# Patient Record
Sex: Female | Born: 1965 | Race: White | Hispanic: No | Smoking: Former smoker
Health system: Southern US, Community
[De-identification: ages and names within clinical notes are randomized; demographics above are authoritative.]

## PROBLEM LIST (undated history)

## (undated) DIAGNOSIS — F17200 Nicotine dependence, unspecified, uncomplicated: Secondary | ICD-10-CM

## (undated) DIAGNOSIS — N200 Calculus of kidney: Secondary | ICD-10-CM

## (undated) DIAGNOSIS — F419 Anxiety disorder, unspecified: Secondary | ICD-10-CM

## (undated) DIAGNOSIS — I499 Cardiac arrhythmia, unspecified: Secondary | ICD-10-CM

## (undated) DIAGNOSIS — F329 Major depressive disorder, single episode, unspecified: Secondary | ICD-10-CM

## (undated) DIAGNOSIS — F32A Depression, unspecified: Secondary | ICD-10-CM

## (undated) DIAGNOSIS — M549 Dorsalgia, unspecified: Secondary | ICD-10-CM

## (undated) DIAGNOSIS — G8929 Other chronic pain: Secondary | ICD-10-CM

## (undated) HISTORY — DX: Nicotine dependence, unspecified, uncomplicated: F17.200

## (undated) HISTORY — DX: Other chronic pain: G89.29

## (undated) HISTORY — DX: Anxiety disorder, unspecified: F41.9

## (undated) HISTORY — PX: SPINAL FUSION: SHX223

## (undated) HISTORY — DX: Depression, unspecified: F32.A

## (undated) HISTORY — DX: Dorsalgia, unspecified: M54.9

## (undated) HISTORY — DX: Major depressive disorder, single episode, unspecified: F32.9

## (undated) HISTORY — PX: OOPHORECTOMY: SHX86

---
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
## 1997-11-06 ENCOUNTER — Inpatient Hospital Stay (HOSPITAL_COMMUNITY): Admission: AD | Admit: 1997-11-06 | Discharge: 1997-11-06 | Payer: Self-pay | Admitting: Obstetrics and Gynecology

## 1998-01-30 ENCOUNTER — Ambulatory Visit (HOSPITAL_COMMUNITY): Admission: RE | Admit: 1998-01-30 | Discharge: 1998-01-30 | Payer: Self-pay | Admitting: Obstetrics and Gynecology

## 1998-01-30 ENCOUNTER — Encounter: Payer: Self-pay | Admitting: Obstetrics and Gynecology

## 1998-03-16 ENCOUNTER — Other Ambulatory Visit: Admission: RE | Admit: 1998-03-16 | Discharge: 1998-03-16 | Payer: Self-pay | Admitting: Obstetrics and Gynecology

## 1998-05-02 ENCOUNTER — Ambulatory Visit (HOSPITAL_COMMUNITY): Admission: RE | Admit: 1998-05-02 | Discharge: 1998-05-02 | Payer: Self-pay | Admitting: Obstetrics and Gynecology

## 1998-12-08 ENCOUNTER — Inpatient Hospital Stay (HOSPITAL_COMMUNITY): Admission: AD | Admit: 1998-12-08 | Discharge: 1998-12-08 | Payer: Self-pay | Admitting: Obstetrics and Gynecology

## 1999-05-25 ENCOUNTER — Other Ambulatory Visit: Admission: RE | Admit: 1999-05-25 | Discharge: 1999-05-25 | Payer: Self-pay | Admitting: Obstetrics and Gynecology

## 1999-07-28 ENCOUNTER — Inpatient Hospital Stay (HOSPITAL_COMMUNITY): Admission: AD | Admit: 1999-07-28 | Discharge: 1999-07-28 | Payer: Self-pay | Admitting: Obstetrics and Gynecology

## 1999-11-14 ENCOUNTER — Inpatient Hospital Stay (HOSPITAL_COMMUNITY): Admission: AD | Admit: 1999-11-14 | Discharge: 1999-11-17 | Payer: Self-pay | Admitting: *Deleted

## 1999-12-10 ENCOUNTER — Encounter: Admission: RE | Admit: 1999-12-10 | Discharge: 2000-03-09 | Payer: Self-pay | Admitting: Obstetrics and Gynecology

## 1999-12-23 ENCOUNTER — Other Ambulatory Visit: Admission: RE | Admit: 1999-12-23 | Discharge: 1999-12-23 | Payer: Self-pay | Admitting: Obstetrics and Gynecology

## 2001-01-15 ENCOUNTER — Other Ambulatory Visit: Admission: RE | Admit: 2001-01-15 | Discharge: 2001-01-15 | Payer: Self-pay | Admitting: Obstetrics and Gynecology

## 2002-01-21 ENCOUNTER — Other Ambulatory Visit: Admission: RE | Admit: 2002-01-21 | Discharge: 2002-01-21 | Payer: Self-pay | Admitting: Obstetrics and Gynecology

## 2002-09-26 ENCOUNTER — Encounter: Payer: Self-pay | Admitting: Specialist

## 2002-09-26 ENCOUNTER — Encounter: Admission: RE | Admit: 2002-09-26 | Discharge: 2002-09-26 | Payer: Self-pay | Admitting: Specialist

## 2003-02-12 ENCOUNTER — Other Ambulatory Visit: Admission: RE | Admit: 2003-02-12 | Discharge: 2003-02-12 | Payer: Self-pay | Admitting: Obstetrics and Gynecology

## 2003-12-11 ENCOUNTER — Encounter: Admission: RE | Admit: 2003-12-11 | Discharge: 2003-12-11 | Payer: Self-pay | Admitting: Obstetrics and Gynecology

## 2004-03-22 ENCOUNTER — Other Ambulatory Visit: Admission: RE | Admit: 2004-03-22 | Discharge: 2004-03-22 | Payer: Self-pay | Admitting: Obstetrics and Gynecology

## 2005-01-05 ENCOUNTER — Encounter: Admission: RE | Admit: 2005-01-05 | Discharge: 2005-01-05 | Payer: Self-pay | Admitting: Obstetrics and Gynecology

## 2005-03-07 HISTORY — PX: TOTAL ABDOMINAL HYSTERECTOMY W/ BILATERAL SALPINGOOPHORECTOMY: SHX83

## 2005-04-08 ENCOUNTER — Other Ambulatory Visit: Admission: RE | Admit: 2005-04-08 | Discharge: 2005-04-08 | Payer: Self-pay | Admitting: Obstetrics and Gynecology

## 2006-01-30 ENCOUNTER — Encounter: Admission: RE | Admit: 2006-01-30 | Discharge: 2006-01-30 | Payer: Self-pay | Admitting: Obstetrics and Gynecology

## 2007-02-05 ENCOUNTER — Encounter: Admission: RE | Admit: 2007-02-05 | Discharge: 2007-02-05 | Payer: Self-pay | Admitting: Obstetrics and Gynecology

## 2007-03-08 HISTORY — PX: CHOLECYSTECTOMY: SHX55

## 2007-05-09 ENCOUNTER — Encounter: Payer: Self-pay | Admitting: Family Medicine

## 2007-08-02 ENCOUNTER — Ambulatory Visit (HOSPITAL_COMMUNITY): Admission: RE | Admit: 2007-08-02 | Discharge: 2007-08-02 | Payer: Self-pay | Admitting: Urology

## 2008-01-29 ENCOUNTER — Ambulatory Visit (HOSPITAL_COMMUNITY): Admission: RE | Admit: 2008-01-29 | Discharge: 2008-01-30 | Payer: Self-pay | Admitting: Obstetrics and Gynecology

## 2008-01-29 ENCOUNTER — Encounter (INDEPENDENT_AMBULATORY_CARE_PROVIDER_SITE_OTHER): Payer: Self-pay | Admitting: Obstetrics and Gynecology

## 2008-02-22 ENCOUNTER — Encounter: Admission: RE | Admit: 2008-02-22 | Discharge: 2008-02-22 | Payer: Self-pay | Admitting: Obstetrics and Gynecology

## 2008-02-25 ENCOUNTER — Encounter: Admission: RE | Admit: 2008-02-25 | Discharge: 2008-02-25 | Payer: Self-pay | Admitting: Obstetrics and Gynecology

## 2009-02-23 ENCOUNTER — Encounter: Admission: RE | Admit: 2009-02-23 | Discharge: 2009-02-23 | Payer: Self-pay | Admitting: Obstetrics and Gynecology

## 2009-04-09 ENCOUNTER — Ambulatory Visit: Payer: Self-pay | Admitting: Family Medicine

## 2009-04-09 DIAGNOSIS — L0100 Impetigo, unspecified: Secondary | ICD-10-CM | POA: Insufficient documentation

## 2009-04-09 DIAGNOSIS — H00019 Hordeolum externum unspecified eye, unspecified eyelid: Secondary | ICD-10-CM | POA: Insufficient documentation

## 2010-02-24 ENCOUNTER — Encounter
Admission: RE | Admit: 2010-02-24 | Discharge: 2010-02-24 | Payer: Self-pay | Source: Home / Self Care | Attending: Obstetrics and Gynecology | Admitting: Obstetrics and Gynecology

## 2010-03-25 ENCOUNTER — Ambulatory Visit
Admission: RE | Admit: 2010-03-25 | Discharge: 2010-03-25 | Payer: Self-pay | Source: Home / Self Care | Attending: Family Medicine | Admitting: Family Medicine

## 2010-03-25 ENCOUNTER — Encounter: Payer: Self-pay | Admitting: Family Medicine

## 2010-03-25 DIAGNOSIS — J209 Acute bronchitis, unspecified: Secondary | ICD-10-CM | POA: Insufficient documentation

## 2010-03-25 DIAGNOSIS — F17200 Nicotine dependence, unspecified, uncomplicated: Secondary | ICD-10-CM | POA: Insufficient documentation

## 2010-03-28 ENCOUNTER — Encounter: Payer: Self-pay | Admitting: Obstetrics and Gynecology

## 2010-04-06 NOTE — Assessment & Plan Note (Signed)
Summary: SINUS/KH   Vital Signs:  Patient Profile:   45 Years Old Female Height:     67 inches Weight:      140 pounds O2 Sat:      99 % O2 treatment:    Room Air Temp:     98.0 degrees F oral Pulse rate:   106 / minute Pulse rhythm:   regular Resp:     16 per minute BP sitting:   124 / 82  (right arm) Cuff size:   regular  Vitals Entered By: Lita Mains, RN                  Updated Prior Medication List: CELEXA 20 MG TABS (CITALOPRAM HYDROBROMIDE) once daily MENEST 1.25 MG TABS (ESTERIFIED ESTROGENS) bid  Current Allergies: No known allergies History of Present Illness History from: patient History of Present Illness: Patient states that since Thanksgiving 2010 she has had "sores or pimples" that keep coming up on her face that itch. [facility] here thing it may be staph. She also c/o sinus congestion for the past week. Her son has the flU. DENIES FEVER. HAS DEVLEOPED A STY UPPER RIGHT EYELIDE  REVIEW OF SYSTEMS Constitutional Symptoms      Denies fever, chills, night sweats, weight loss, weight gain, and fatigue.  Eyes       Denies change in vision, eye pain, eye discharge, glasses, contact lenses, and eye surgery. Ear/Nose/Throat/Mouth       Complains of frequent runny nose and sinus problems.      Denies hearing loss/aids, change in hearing, ear pain, ear discharge, dizziness, frequent nose bleeds, sore throat, hoarseness, and tooth pain or bleeding.  Respiratory       Denies dry cough, productive cough, wheezing, shortness of breath, asthma, bronchitis, and emphysema/COPD.  Cardiovascular       Denies murmurs, chest pain, and tires easily with exhertion.    Gastrointestinal       Denies stomach pain, nausea/vomiting, diarrhea, constipation, blood in bowel movements, and indigestion. Genitourniary       Denies painful urination, kidney stones, and loss of urinary control. Neurological       Denies paralysis, seizures, and  fainting/blackouts. Musculoskeletal       Denies muscle pain, joint pain, joint stiffness, decreased range of motion, redness, swelling, muscle weakness, and gout.  Skin       Complains of unusual moles/lumps or sores.      Denies bruising and hair/skin or nail changes.  Psych       Denies mood changes, temper/anger issues, anxiety/stress, speech problems, depression, and sleep problems.  Past History:  Past Medical History: Unremarkable  Past Surgical History: oopharectomy-09 Cholecystectomy Hysterectomy  Family History: Family History Thyroid disease-mother  Social History: Married Current Smoker-<1PPD Alcohol use-yes Smoking Status:  current Physical Exam General appearance: well developed, well nourished, no acute distress Head: normocephalic, atraumatic Eyes: RIGHT UPPER LID WITH FOCAL ERYTHEMA AND SWELLING NASAL SIDE Ears: normal, no lesions or deformities Nasal: mucosa pink, nonedematous, no septal deviation, turbinates normal Oral/Pharynx: tongue normal, posterior pharynx without erythema or exudate Chest/Lungs: no rales, wheezes, or rhonchi bilateral, breath sounds equal without effort Heart: regular rate and  rhythm, no murmur Skin: SCABBED LESION THAT IS CRUSTED LEFT SIDE OF CHIN Assessment New Problems: 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 APPLY BACTROBAN AS DIRECTED. APPLY WORM COMPRESSES TO STY. MAY USE OTC STY OINTMENT. MUCINEX  RECOMMENDED. FOLLOW UP WITH YOUR PCP OR RETURN IF SYMPTOMS PERSIST OR WORSEN.

## 2010-04-08 NOTE — Assessment & Plan Note (Signed)
Summary: Bronchitis?/vfw   Vital Signs:  Patient profile:   45 year old female Height:      66.25 inches Weight:      148 pounds BMI:     23.79 O2 Sat:      98 % on Room air Temp:     98.0 degrees F oral Pulse rate:   94 / minute BP sitting:   128 / 84  (right arm) Cuff size:   regular  Vitals Entered By: Francee Piccolo CMA Duncan Dull) (March 25, 2010 8:43 AM)  O2 Flow:  Room air CC: cough, chest tightness, [facility] for [facility] 1[facility]-given amoxicillin Is Patient Diabetic? No   History of Present Illness: 45 y/o WF, smoker, presents for first visit here: cough. Onset of runny nose/congestion in head about 5-6 wks ago, had no fever or significant cough.  This part cleared up and a cough developed, chest feels tight and has mild wheezing intermittently.  No fever or SOB.  No ST.  Appetite fine, energy level okay.  No chest pains. Denies hx of need of inhaler for any resp illnesses in the past.   Early on in the course of this illness [facility] and was rx'd amoxil and she says this has not changed anything.  Preventive Screening-Counseling & Management  Alcohol-Tobacco     Alcohol drinks/day: 0     Smoking Status: current  Current Medications (verified): 1)  Sertraline Hcl 100 Mg Tabs (Sertraline Hcl) .Marland Kitchen.. 1 By Mouth Daily 2)  Estradiol 1 Mg Tabs (Estradiol) .... Take 1 in Am and .5 in Pm 3)  Bee Pollen 580 Mg Caps (Bee Pollen) .... Take 1 Capsule By Mouth Once A Day 4)  Multivitamins  Tabs (Multiple Vitamin) .... Take 1 Tablet By Mouth Once A Day  Allergies (verified): No Known Drug Allergies  Past History:  Past Medical History: Chronic back pain: hx of childhood scoliosis, has stabilization rods in back. Tobacco dependence Depression/Anxiety  Family History: Family History Thyroid disease and HTN-mother Father: polymyalgia rheumatica, HTN Brother: HTN  Social History: Married, 26 y/o boy and 46 y/o girl, lives in [place],  [place]. 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  Review of Systems  The patient denies anorexia, fever, weight loss, weight gain, vision loss, decreased hearing, hoarseness, chest pain, syncope, dyspnea on exertion, peripheral edema, headaches, hemoptysis, abdominal pain, melena, hematochezia, severe indigestion/heartburn, hematuria, incontinence, genital sores, muscle weakness, suspicious skin lesions, transient blindness, difficulty walking, depression, unusual weight change, abnormal bleeding, enlarged lymph nodes, angioedema, and breast masses.    Physical Exam  General:  VS: noted, all normal. Gen: Alert, well appearing, oriented x 4. HEENT: Scalp without lesions or hair loss.  Ears: EACs clear, normal epithelium.  TMs with good light reflex and landmarks bilaterally.  Eyes: no injection, icteris, swelling, or exudate.  EOMI, PERRLA. Nose: no drainage or turbinate edema/swelling.  No inection or focal lesion.  Mouth: lips without lesion/swelling.  Oral mucosa pink and moist.  Dentition intact and without obvious caries or gingival swelling.  Oropharynx without erythema, exudate, or swelling.  Neck: supple.  No lymphadenopathy, thyromegaly, or mass. Chest: symmetric expansion, with nonlabored respirations.  Clear and equal breath sounds in all lung fields.   CV: RRR, no m/r/g.  Peripheral pulses 2+/symmetric. EXT: no clubbing, cyanosis, or edema.     Impression & Recommendations:  Problem # 1:  ACUTE BRONCHITIS (ICD-466.0) Assessment New Prednisone 40mg  once daily x 5d.  Ventolin q4h as needed.  Hydrocodone cough syrup q6h as needed.  Encouraged smoking cessation.  She has had her flu vaccine this season.  The following medications were removed from the medication list:    Doxycycline Hyclate 100 Mg Caps (Doxycycline hyclate) .Marland Kitchen... 1 by mouth q 12 hrs Her updated medication list for this problem includes:    Ventolin Hfa 108 (90 Base) Mcg/act Aers (Albuterol  sulfate) .Marland Kitchen... 1-2 puffs q4h as needed chest tightness, excessive coughing, wheezing    Hydrocodone-homatropine 5-1.5 Mg/48ml Syrp (Hydrocodone-homatropine) .Marland Kitchen... 1 tsp q6h as needed for cough  Problem # 2:  TOBACCO ABUSE (ICD-305.1) Assessment: Unchanged Encourage cessation, spent discussing this.  She wants to quit but is not ready to attempt cessation at this time.  Complete Medication List: 1)  Sertraline Hcl 100 Mg Tabs (Sertraline hcl) .Marland Kitchen.. 1 by mouth daily 2)  Estradiol 1 Mg Tabs (Estradiol) .... Take 1 in am and .5 in pm 3)  Bee Pollen 580 Mg Caps (Bee pollen) .... Take 1 capsule by mouth once a day 4)  Multivitamins Tabs (Multiple vitamin) .... Take 1 tablet by mouth once a day 5)  Prednisone 20 Mg Tabs (Prednisone) .... 2 tabs by mouth once daily x 5d 6)  Ventolin Hfa 108 (90 Base) Mcg/act Aers (Albuterol sulfate) .Marland Kitchen.. 1-2 puffs q4h as needed chest tightness, excessive coughing, wheezing 7)  Hydrocodone-homatropine 5-1.5 Mg/76ml Syrp (Hydrocodone-homatropine) .Marland Kitchen.. 1 tsp q6h as needed for cough  Patient Instructions: 1)  Call or return if not improving in the next 5-7d. Prescriptions: HYDROCODONE-HOMATROPINE 5-1.5 MG/5ML SYRP (HYDROCODONE-HOMATROPINE) 1 tsp q6h as needed for cough  #4 oz x 0   Entered and Authorized by:   Michell Heinrich M.D.   Signed by:   Michell Heinrich M.D. on 03/25/2010   Method used:   Print then Give to Patient   RxID:   [id number] VENTOLIN HFA 108 (90 BASE) MCG/ACT AERS (ALBUTEROL SULFATE) 1-2 puffs q4h as needed chest tightness, excessive coughing, wheezing  #1 x 0   Entered and Authorized by:   Michell Heinrich M.D.   Signed by:   Michell Heinrich M.D. on 03/25/2010   Method used:   Electronically to        [facility]  [address] [phone]* (retail)       [address] [place], [place]  [address]       Ph: [phone] or [phone]       Fax: [phone]   RxID:   [id number] PREDNISONE 20 MG TABS (PREDNISONE)  2 tabs by mouth once daily x 5d  #10 x 0   Entered and Authorized by:   Michell Heinrich M.D.   Signed by:   Michell Heinrich M.D. on 03/25/2010   Method used:   Electronically to        [facility]  [address] [phone]* (retail)       [address] [place], [place]  [address]       Ph: [phone] or [phone]       Fax: [phone]   RxID:   [id number]    Orders Added: 1)  New Patient Level III [99203]   Immunization History:  Influenza Immunization History:    Influenza:  historical (12/05/2009)   Immunization History:  Influenza Immunization History:    Influenza:  Historical (  12/05/2009)  Prevention & Chronic Care Immunizations   Influenza vaccine: Historical  (12/05/2009)    Tetanus booster: Not documented    Pneumococcal vaccine: Not documented  Other Screening   Pap smear: Not documented    Mammogram: Not documented   Smoking status: current  (03/25/2010)  Lipids   Total Cholesterol: Not documented   LDL: Not documented   LDL Direct: Not documented   HDL: Not documented   Triglycerides: Not documented

## 2010-04-08 NOTE — Miscellaneous (Signed)
  Clinical Lists Changes  Medications: Removed medication of HYDROCODONE-HOMATROPINE 5-1.5 MG/5ML SYRP (HYDROCODONE-HOMATROPINE) 1 tsp q6h as needed for cough Removed medication of VENTOLIN HFA 108 (90 BASE) MCG/ACT AERS (ALBUTEROL SULFATE) 1-2 puffs q4h as needed chest tightness, excessive coughing, wheezing Added new medication of XANAX 0.25 MG TABS (ALPRAZOLAM) 1/2 tab q8h as needed for severe anxiety Observations: Added new observation of PAST SURG HX: Cholecystectomy 2009 BSO & Hysterectomy 2007 C-section 2001 Scoliosis stabilization 1979  (03/25/2010 10:50) Added new observation of PRIMARY MD: Nicoletta Ba, MD (03/25/2010 10:50)       Past History:  Past Surgical History: Cholecystectomy 2009 BSO & Hysterectomy 2007 C-section 2001 Scoliosis stabilization 1979   Allergies: No Known Drug Allergies

## 2010-04-13 ENCOUNTER — Encounter: Payer: Self-pay | Admitting: Family Medicine

## 2010-04-22 NOTE — Miscellaneous (Signed)
  Clinical Lists Changes  Observations: Added new observation of UROLOGY MD: Dr. Vonita Moss ([facility]) (04/13/2010 11:09) Added new observation of GYNECO MD: Dr. Marcelle Overlie ([facility]) (04/13/2010 11:09) Added new observation of PAST MED HX: Chronic back pain: hx of childhood scoliosis, has stabilization rods in back. Tobacco dependence Depression/Anxiety Nephrolithiasis Infertility (Clomid aided in + preg 2001) Early menopause (39) Osteopenia (DEXA 2007--Boniva rx'd after) Constipation (04/13/2010 11:09) Added new observation of PAST SURG HX: Cholecystectomy 2009 BSO & LAVH 2009 Lithotripsy 2009 C-section 2001 (breech and PIH) Left ovarian cyst lapyroscopic drainage 2000 Scoliosis stabilization 1979  (04/13/2010 11:09) Added new observation of PRIMARY MD: Nicoletta Ba, MD (04/13/2010 11:09)       Past History:  Past Medical History: Chronic back pain: hx of childhood scoliosis, has stabilization rods in back. Tobacco dependence Depression/Anxiety Nephrolithiasis Infertility (Clomid aided in + preg 2001) Early menopause (39) Osteopenia (DEXA 2007--Boniva rx'd after) Constipation  Past Surgical History: Cholecystectomy 2009 BSO & LAVH 2009 Lithotripsy 2009 C-section 2001 (breech and [facility]) Left ovarian cyst lapyroscopic drainage 2000 Scoliosis stabilization 1979   Care Coordination Gynecologist: Dr. Marcelle Overlie ([facility]) Urologist: Dr. Vonita Moss ([facility])

## 2010-05-13 NOTE — Letter (Signed)
Summary: [facility] records  [facility] records   Imported By: Lester Whigham 05/07/2010 07:35:23  _____________________________________________________________________  External Attachment:    Type:   Image     Comment:   External Document

## 2010-05-14 ENCOUNTER — Encounter: Payer: Self-pay | Admitting: *Deleted

## 2010-07-20 NOTE — H&P (Signed)
Katherine Brock, Katherine Brock              ACCOUNT NO.:  [id number]   MEDICAL RECORD NO.:  [id number]          PATIENT TYPE:  AMB   LOCATION:  SDC                           [facility]ILITY:  [facility]   PHYSICIAN:  Duke Salvia. Marcelle Brock, M.D.DATE OF BIRTH:  [birth year]   DATE OF ADMISSION:  DATE OF DISCHARGE:                              HISTORY & PHYSICAL   CHIEF COMPLAINT:  1. Ovarian cyst.  2. Pelvic pain.   HISTORY OF PRESENT ILLNESS:  A 45 year old G1, P1, one previous cesarean  section.  She has one adopted child, two living children.  Has gone  through early menopause, is currently on ERT estradiol 1 mg daily.  Earlier this year she had kidney stones and in the process of evaluating  her by CT she was noted to have a simple-appearing ovarian cyst that has  persisted.  She did have lithotripsy for her stones which cleared up but  part of her evaluation July of 2009 demonstrated a CA-125 that was  normal at 8.8, her ultrasound in July and then again in September of  2009 showed a right ovary with a small 1.6 x 1.6 cm simple cyst, the  left ovary 6.1 x 5.4 with internal echoes possibly suggestive of a  cystadenoma.  She presents now for LAVH BSO.  This procedure including  risks related to bleeding, infection, adjacent organ injury, the  possible need for open or additional surgery are reviewed with her.  We  also discussed expected recovery time, the possible need for ERT and  other risks related, transfusion, phlebitis, etc.   PAST MEDICAL HISTORY:   ALLERGIES:  NONE.   CURRENT MEDICATIONS:  1. Celexa.  2. Multivitamins.  3. Vitamin C.  4. Estradiol 1 mg daily.   REVIEW OF SYSTEMS:  1. Significant for a history of scoliosis, she has had steel rods      implanted in 1979.  2. C-section 2001.  3. Cholecystectomy in 2009.  4. Lithotripsy for kidney stones.   FAMILY HISTORY:  Significant for history of arthritis and hypertension.   PHYSICAL EXAM:  Temperature 98.2, blood pressure 110/80.  HEENT:  Unremarkable.  NECK:  Supple without mass.  LUNGS:  Clear.  CARDIOVASCULAR:  Regular rate and rhythm without murmurs, rubs or  gallops.  BREASTS:  Without masses.  ABDOMEN:  Soft, flat and nontender.  PELVIC:  Normal external genitalia, vagina and cervix clear.  Uterus mid-  positional size.  Adnexa negative, slightly full on the left, no  definite mass noted.  Extremities and neurologic exam unremarkable.   IMPRESSION:  Persistent left ovarian cyst, possible cystadenoma   PLAN:  LAVH BSO, procedure and risks reviewed as above.      Katherine Brock, M.D.  Electronically Signed     RMH/MEDQ  D:  01/28/2008  T:  01/28/2008  Job:  [id number]

## 2010-07-20 NOTE — Op Note (Signed)
Katherine Brock, Katherine Brock              ACCOUNT NO.:  [id number]   MEDICAL RECORD NO.:  [id number]          PATIENT TYPE:  OIB   LOCATION:  9306                          [facility]ILITY:  [facility]   PHYSICIAN:  Duke Salvia. Marcelle Overlie, M.D.DATE OF BIRTH:  Aug 01, 1965   DATE OF PROCEDURE:  01/29/2008  DATE OF DISCHARGE:                               OPERATIVE REPORT   PREOPERATIVE DIAGNOSES:  Pelvic pain, persistent left ovarian cyst.   PROCEDURE:  Laparoscopic-assisted vaginal hysteroscopy and bilateral  salpingo-oophorectomy.   SURGEON:  Duke Salvia. Marcelle Overlie, MD   ASSISTANT:  Juluis Mire, MD   ANESTHESIA:  General endotracheal.   COMPLICATIONS:  None.   DRAINS:  Foley catheter.   ESTIMATED BLOOD LOSS:  200 mL.   SPECIMENS REMOVED:  Uterus, bilateral tubes, and ovaries.   PROCEDURE AND FINDINGS:  The patient was taken to the operating room  after an adequate level of general endotracheal anesthesia was obtained.  With the patient's legs in stirrups the abdomen, perineum, and vagina  were prepped and draped in the usual manner for laparoscopy.  The  bladder was drained.  EUA carried out.  The Kahn cannula was positioned  for manipulation on the uterus.  Attention directed to the abdomen where  a subumbilical area was infiltrated with 0.25% Marcaine plain.  A small  incision was made and the Veress needle was introduced without  difficulty.  After 2-1/2 L pneumoperitoneum has been created  laparoscopic trocar and sleeve were then introduced without difficulty.  Three fingerbreadths above the symphysis in the midline, a 5-mm trocar  was inserted under direct visualization.  The patient was then placed in  Trendelenburg and the pelvic findings were as follows.   Uterus itself was small.  Right adnexa unremarkable.  There were some  moderately dense adhesions of the bladder flap half way up the fundus  from her prior cesarean.  Left ovary was enlarged with 5- to 6-cm smooth-  walled cyst.  There  was no ascites noted.  Using a grasper, the right  tube and ovary was placed on traction toward the midline.  The right  pelvic side wall was inspected and the EnSeal device was then used to  coagulate and divide the infundibulopelvic ligament down to and  occluding the round ligament.  This was done after carefully noting that  the course of the ureter was well below.  Same repeated on the opposite  side freeing up the left ovary with the cyst intact.  Because of the  dense bladder flap adhesions, these were taken down with sharp and blunt  dissection to make the vaginal portion of the procedure easier.  Once  this was freed up and the adhesions were advanced below attention was  directed to the vaginal portion of the procedure.   Her legs were extended, weighted speculum was positioned, and cervix was  grasped with a tenaculum.  The cervicovaginal mucosa was incised with a  Bovie.  Posterior culdotomy was performed without difficulty.  Bladder  was advanced superiorly with sharp and blunt dissection.  Retractor was  then used to gently elevate the  bladder out of the field once the  peritoneum was entered anteriorly.  In sequential manner, the LigaSure  was then used to coagulate and divide the uterosacral ligaments,  cardinal ligament, uterine vasculature, pedicle, and upper broad  ligament pedicles.  The fundus of the uterus was then delivered  posteriorly.  All except for the left ovarian cyst could be removed  vaginally.  This had to be incised and suction used to drain vaginally  to deflate straw-colored fluid.  Once this was completed, the cuff was  closed in 3 o'clock to 9 o'clock with a running locked 2-0 Vicryl  suture.  The vaginal mucosa was closed right to left with interrupted 2-  0 Monocryl sutures with excellent hemostasis.  Foley catheter positioned  draining clear urine.  Repeat laparoscopy with irrigation with a Nezhat  revealed minimal bleeding along the cuff, which  was coagulated  superficially.  Repeat irrigation and aspiration under reduced pressure  revealed excellent hemostasis.  The instruments were removed.  Gas was  then allowed to escape.  Defects were closed with 4-0 Dexon,  subcuticular sutures, and Dermabond.  She tolerated this well and went  to recovery room in good condition.      Richard M. Marcelle Overlie, M.D.  Electronically Signed     RMH/MEDQ  D:  01/29/2008  T:  01/29/2008  Job:  [id number]

## 2010-07-23 NOTE — H&P (Signed)
[facility]  Patient:    Katherine Brock, Katherine Brock                     MRN: [id number] Adm. Date:  [id number] Disc. Date: [id number] Attending:  Trevor Iha                         History and Physical  HISTORY OF PRESENT ILLNESS:   The patient is a 45 year old primigravida married white female whose estimated date of confinement is December 01, 1999.  This gives her an estimated gestational age of 37-and-a-half weeks, consistent with initial exam and ultrasound.  Dates are fairly firm, as she had been an infertility patient.  She has had a relatively uncomplicated prenatal course.  She has had some elevation of blood pressures in the last several weeks.  It was found to breech and was scheduled for a C section in the next week.  She presented this evening complaining of increasing left lower quadrant pain and discomfort, to rule out labor.  Blood pressures in triage were elevated, with systolic values of 145-154 over diastolic values of 90-100.  We subsequently did a PIH panel which revealed mildly elevated liver function tests in the form of an SGOT and SGPT.  Her platelet functions were within normal limits.  In view of the elevated blood pressures and rising liver function tests, the decision was to proceed with delivery.  Ultrasound did confirm breech presentation, and she is set up for a primary cesarean section.  ALLERGIES:                    No known drug allergies.  MEDICATIONS:                  Prenatal vitamins.  PRENATAL LABORATORY:          Patient is O positive, negative antibody screen, nonreactive serology, positive rubella, negative hepatitis B surface antigen, negative HIV.  PAST MEDICAL HISTORY, FAMILY HISTORY, AND SOCIAL HISTORY:  Please see prenatal records.  REVIEW OF SYSTEMS:            Noncontributory.  PHYSICAL EXAMINATION:  VITAL SIGNS:                  Patients blood pressure is 140/98.  Other vital signs are  stable.  HEENT:                        Patient normocephalic.  Pupils equal, round, and reactive to light and accommodation.  Extraocular movements are intact. Sclerae and conjunctivae are clear. oropharynx clear.  NECK:                         Without thyromegaly.  BREASTS:                      Glandular but no discrete masses.  LUNGS:                        Clear.  CARDIOVASCULAR:               Regular rate with a grade 2/6 systolic ejection murmur, no clicks or gallops.  ABDOMEN:                      Gravid uterus.  PELVIC:  Cervix long and closed.  EXTREMITIES:                  Edema 1+, deep tendon reflexes 2-3+ with no clonus.  LABORATORY DATA:              Ultrasound evaluation confirms breech presentation.  Her SGOT was 41, SGPT 51, LDH 183, platelet count 252, uric acid 4.0.  IMPRESSION:                   1. Intrauterine pregnancy at 37-and-a-half                                  weeks, breech presentation.                               2. Pregnancy-induced hypertension with elevated                                  liver function tests.  PLAN:                         The patient is to undergo primary cesarean section. The risks have been discussed, including the risk of prematurity; the risk of infection; the risk of hemorrhage that could necessitate transfusion with the risk of AIDS or hepatitis; the risk of injury to adjacent organs, including bladder, bowel, or ureters; the risk of deep venous thrombosis and pulmonary embolus.  Patient professed an understanding of indications and risks. DD:  11/14/99 TD:  11/14/99 Job: [id number]

## 2010-07-23 NOTE — Discharge Summary (Signed)
[facility]  Patient:    Katherine Brock, Katherine Brock                     MRN: [id number] Adm. Date:  [id number] Disc. Date: [id number] Attending:  Frederich Balding Dictator:   Danie Chandler, R.N.                           Discharge Summary  ADMITTING DIAGNOSES:          1. Intrauterine pregnancy at 37-1/[redacted] weeks                                  gestation.                               2. Breech presentation.                               3. Pregnancy-induced hypertension with elevated                                  liver function tests.  DISCHARGE DIAGNOSES:          1. Intrauterine pregnancy at 37-1/[redacted] weeks                                  gestation.                               2. Breech presentation.                               3. Pregnancy-induced hypertension with elevated                                  liver function tests.                               4. Anemia.  PROCEDURE:                    On November 14, 1999, primary low transverse cesarean section.  HISTORY OF PRESENT ILLNESS:   Please see dictated H&P.  HOSPITAL COURSE:              The patient was taken to the operating room and underwent the above-named procedure without complication.  This was productive of a viable female infant with Apgars of 8 at one minute and 9 at five minutes. Postoperatively, the patient was on magnesium sulfate.  On postoperative day #1, the patient was only complaining of a slight headache.  Her blood pressures were 108-130/55-70.  Her SGOT was 34 and SGPT 38.  Her hemoglobin was 7.2.  Uric acid 4.1.  She was stable on this day and continued on magnesium sulfate.  On postoperative day #2, the patient was complaining of a frontal headache, responding to Tylenol, she was without epigastric pain, she was ambulating well, blood  pressures were 120s-130s/60s-70s.  Deep tendon reflexes were 2+ with no clonus.  She had no epigastric tenderness.  Her labs did show  her liver function tests to be normal.  Her magnesium sulfate was discontinued on this day.  On postoperative day #3, the patient was without complaint, she denied headache, visual change, or right upper quadrant pain, she had a good return of bowel function, was tolerating a regular diet, and she also had good pain control.  Her hemoglobin on this day was 6.6.  She was discharged home.  CONDITION ON DISCHARGE:       Stable.  DIET:                         Regular, as tolerated.  ACTIVITY:                     No heavy lifting.  No driving.  No vaginal entry.  FOLLOW-UP:                    She is to follow up in the office on Friday.  DISCHARGE INSTRUCTIONS:       She is to call for temperature greater than 100 degrees, persistent nausea/vomiting, heavy vaginal bleeding and/or redness or drainage from the incision site.  DISCHARGE MEDICATIONS:        1. Prenatal vitamin one p.o. q.d.                               2. Tylox as directed by MD.                               3. Iron as directed by MD. DD:  12/09/99 TD:  12/09/99 Job: [id number]

## 2010-07-23 NOTE — Op Note (Signed)
[facility]  Patient:    Katherine Brock, Katherine Brock                     MRN: [id number] Proc. Date: 11/14/99 Adm. Date:  [id number] Attending:  Frederich Balding                           Operative Report  PREOPERATIVE DIAGNOSES:       1. Intrauterine pregnancy at 37-1/2 weeks.                               2. Breech presentation.                               3. Pregnancy-induced hypertension with elevated                                  liver function tests.  POSTOPERATIVE DIAGNOSIS:      1. Intrauterine pregnancy at 37-1/2 weeks.                               2. Breech presentation.                               3. Pregnancy-induced hypertension with elevated                                  liver function tests.  OPERATION:                    Low transverse cesarean section.  SURGEON:                      Juluis Mire, M.D.  ANESTHESIA:                   General endotracheal.  ESTIMATED BLOOD LOSS:         800 cc.  PACKS AND DRAINS:             None.  INTRAOPERATIVE BLOOD REPLACED:  None.  COMPLICATIONS:                None.  INDICATIONS:                  As dictated in history and physical.  DESCRIPTION OF PROCEDURE:     The patient was taken to the OR and placed in the supine position with a left lateral tilt.  The abdomen was prepped out with Betadine and draped as a sterile field.  Attempts had previously been made to do a spinal, which were unsuccessful due to the patients Harrington rods.  The patient was then intubated.  After a satisfactory level of general endotracheal anesthesia was obtained, a low transverse skin incision was made with a knife and carried through the subcutaneous tissue.  The fascia was entered sharply and the incision in the fascia extended laterally.  The fascia taken off the muscle superiorly and inferiorly.  The rectus muscles were separated in the midline.  The anterior peritoneum was entered sharply and  the  incision in the peritoneum extended both superiorly and inferiorly.  A low transverse bladder flap was developed.  The low transverse uterine incision was begun with a knife and extended laterally using Mayo traction.  The infant was in the breech presentation.  The amniotic fluid was clear.  The infant was a viable female who weighed 6 pounds 15 ounces, Apgars were 8 and 9.  Umbilical artery pH was 7.32.  The placenta was then delivered manually.  The uterus was wiped free of remaining membranes and placenta.  No uterine anomalies were noted.  The uterus was then exteriorized for closure.  Tubes and ovaries were unremarkable.  The uterus was closed with a running locking suture of 0 chromic using the two-layered closure technique.  We had good hemostasis. Some areas of bright red bleeding were brought under control with figure-of-eights of 0 chromic.  The uterus was then returned to the abdominal cavity.  The pelvic cavity was thoroughly irrigated.  Hemostasis was excellent.  Urine output remained clear and adequate.  The muscles were reapproximated with a running suture of 3-0 Vicryl.  The fascia was closed with a running suture of 0 PDS.  The skin was closed with staples and Steri-Strips.  Sponge, instrument, and needle counts were reported as correct by the circulating nurse x 2.  Foley catheter remained clear at time of closure.  The patient tolerated the procedure well and was returned to the recovery room in good condition. DD:  11/14/99 TD:  11/15/99 Job: [id number]

## 2010-12-08 LAB — COMPREHENSIVE METABOLIC PANEL WITH GFR
ALT: 24
AST: 27
Albumin: 4.3
Alkaline Phosphatase: 74
BUN: 10
CO2: 29
Calcium: 9.6
Chloride: 104
Creatinine, Ser: 0.6
GFR calc non Af Amer: >60
Glucose, Bld: 88
Potassium: 4.3
Sodium: 139
Total Bilirubin: 0.6
Total Protein: 6.5

## 2010-12-08 LAB — TYPE AND SCREEN
ABO/RH(D): O POS
Antibody Screen: NEGATIVE

## 2010-12-08 LAB — ABO/RH: ABO/RH(D): O POS

## 2010-12-08 LAB — CBC
Hemoglobin: 13.5
Hemoglobin: 8.7 — ABNORMAL LOW
MCHC: 33.9
Platelets: 222
Platelets: 273
RDW: 13.3
RDW: 13.3

## 2010-12-08 LAB — PREGNANCY, URINE

## 2011-01-16 ENCOUNTER — Encounter: Payer: Self-pay | Admitting: Emergency Medicine

## 2011-01-16 ENCOUNTER — Emergency Department (INDEPENDENT_AMBULATORY_CARE_PROVIDER_SITE_OTHER)
Admission: EM | Admit: 2011-01-16 | Discharge: 2011-01-16 | Disposition: A | Discharge status: Home / Self Care | Payer: BC Managed Care – PPO | Source: Home / Self Care

## 2011-01-16 DIAGNOSIS — J209 Acute bronchitis, unspecified: Secondary | ICD-10-CM

## 2011-01-16 MED ORDER — AZITHROMYCIN 250 MG PO TABS: ORAL_TABLET | ORAL | Status: AC

## 2011-01-16 MED ORDER — BENZONATATE 200 MG PO CAPS: 200.0000 mg | ORAL_CAPSULE | Freq: Every day | ORAL | Status: AC

## 2011-01-16 NOTE — ED Provider Notes (Signed)
History     CSN: [id number] Arrival date & time: 01/16/2011 12:19 PM   First MD Initiated Contact with Patient 01/16/11 1309      Chief Complaint  Patient presents with  . URI     HPI Comments: Patient developed nasal congestion about 1.5 weeks ago, followed by a persistent cough about one week ago.  She has had fever for about a week, up to 101.2 last night.  The cough is productive and worse at night.  Patient is a 45 y.o. female presenting with URI. The history is provided by the patient.  URI The primary symptoms include fever, fatigue, sore throat and cough. Primary symptoms do not include headaches, ear pain, wheezing, abdominal pain, nausea, myalgias or rash. The current episode started more than 1 week ago. This is a new problem.  Symptoms associated with the illness include chills, congestion and rhinorrhea. The illness is not associated with facial pain or sinus pressure.    Past Medical History  Diagnosis Date  . Chronic back pain     hx of childhood scoliosis, has stabilization rods in back  . Tobacco dependence   . Depression   . Anxiety     Past Surgical History  Procedure Date  . Cholecystectomy 2009  . Total abdominal hysterectomy w/ bilateral salpingoophorectomy 2007  . Cesarean section 2001  . Scoliosis stabilization 1979    Family History  Problem Relation Age of Onset  . Hypertension Mother   . Thyroid disease Mother   . Hypertension Father   . Polymyalgia rheumatica Father   . Hypertension Brother     History  Substance Use Topics  . Smoking status: Current Everyday Smoker -- 1.0 packs/day for 20 years    Types: Cigarettes  . Smokeless tobacco: Not on file  . Alcohol Use: Yes    OB History    Grav Para Term Preterm Abortions TAB SAB Ect Mult Living                  Review of Systems  Constitutional: Positive for fever, chills and fatigue.  HENT: Positive for congestion, sore throat and rhinorrhea. Negative for ear pain and sinus  pressure.   Eyes: Negative.   Respiratory: Positive for cough. Negative for chest tightness, shortness of breath and wheezing.   Cardiovascular: Negative.   Gastrointestinal: Negative.  Negative for nausea and abdominal pain.  Genitourinary: Negative.   Musculoskeletal: Negative for myalgias.  Skin: Negative for rash.  Neurological: Negative for headaches.    Allergies  Review of patient's allergies indicates no known allergies.  Home Medications   Current Outpatient Rx  Name Route Sig Dispense Refill  . ALPRAZOLAM 0.25 MG PO TABS Oral Take 0.25 mg by mouth as needed. 1/2 tab q 8 hours prn for severe anxiety     . AZITHROMYCIN 250 MG PO TABS  Take 2 tabs today; then begin one tab once daily for 4 more days.  6 each 0  . BEE POLLEN 580 MG PO CAPS Oral Take 1 capsule by mouth daily.      Marland Kitchen BENZONATATE 200 MG PO CAPS Oral Take 1 capsule (200 mg total) by mouth at bedtime. 12 capsule 0  . ESTRADIOL 1 MG PO TABS Oral Take 1 mg by mouth 2 (two) times daily. Take 1 in AM and 1/2 in PM     . THERAPEUTIC MULTIVITAMIN PO TABS Oral Take 1 tablet by mouth daily.      . SERTRALINE HCL 100 MG PO  TABS Oral Take 100 mg by mouth daily.        BP 130/90  Pulse 96  Temp(Src) 99.1 F (37.3 C) (Oral)  Resp 18  Ht 5' 6.5" (1.689 m)  Wt 158 lb (71.668 kg)  BMI 25.12 kg/m2  SpO2 98%  Physical Exam  Constitutional: She is oriented to person, place, and time. She appears well-developed and well-nourished. No distress.  HENT:  Head: Normocephalic.  Right Ear: External ear normal.  Left Ear: External ear normal.  Nose: Mucosal edema and rhinorrhea present. No sinus tenderness.  Mouth/Throat: Oropharynx is clear and moist. No oropharyngeal exudate.  Eyes: Conjunctivae and EOM are normal. Pupils are equal, round, and reactive to light. Right eye exhibits no discharge. Left eye exhibits no discharge.  Neck: Normal range of motion. Neck supple.  Cardiovascular: Normal rate, regular rhythm and normal  heart sounds.   Pulmonary/Chest: Effort normal and breath sounds normal.  Abdominal: Soft. Bowel sounds are normal. There is no tenderness.  Musculoskeletal: She exhibits no edema.  Lymphadenopathy:    She has cervical adenopathy.       Right cervical: Posterior cervical adenopathy present.       Left cervical: Posterior cervical adenopathy present.  Neurological: She is alert and oriented to person, place, and time.  Skin: Skin is warm and dry. She is not diaphoretic.    ED Course  Procedures None   Labs Reviewed  POCT RAPID STREP A (OFFICE) Negative      1. Acute bronchitis       MDM  Begin Azithromycin  Begin expectorant, topical decongestant, saline nasal spray and/or saline irrigation, and cough suppressant at bedtime.  Avoid antihistamines for now. Increase fluid intake, rest. Followup with PCP if not improving 7 to 10 days.         Donna Christen, MD 01/17/11 [phone]

## 2011-01-16 NOTE — ED Notes (Signed)
Cough, fever, congestion, fatigue x 10 days.  Family members on antibiotics.

## 2011-02-16 ENCOUNTER — Other Ambulatory Visit: Payer: Self-pay | Admitting: Obstetrics and Gynecology

## 2011-02-16 DIAGNOSIS — Z1231 Encounter for screening mammogram for malignant neoplasm of breast: Secondary | ICD-10-CM

## 2011-03-14 ENCOUNTER — Ambulatory Visit
Admission: RE | Admit: 2011-03-14 | Discharge: 2011-03-14 | Disposition: A | Discharge status: Home / Self Care | Payer: BC Managed Care – PPO | Source: Ambulatory Visit | Attending: Obstetrics and Gynecology | Admitting: Obstetrics and Gynecology

## 2011-03-14 DIAGNOSIS — Z1231 Encounter for screening mammogram for malignant neoplasm of breast: Secondary | ICD-10-CM

## 2012-03-15 ENCOUNTER — Other Ambulatory Visit: Payer: Self-pay | Admitting: Obstetrics and Gynecology

## 2012-03-15 DIAGNOSIS — Z1231 Encounter for screening mammogram for malignant neoplasm of breast: Secondary | ICD-10-CM

## 2012-03-29 ENCOUNTER — Ambulatory Visit: Payer: BC Managed Care – PPO

## 2012-04-09 ENCOUNTER — Ambulatory Visit
Admission: RE | Admit: 2012-04-09 | Discharge: 2012-04-09 | Disposition: A | Discharge status: Home / Self Care | Payer: BC Managed Care – PPO | Source: Ambulatory Visit | Attending: Obstetrics and Gynecology | Admitting: Obstetrics and Gynecology

## 2012-04-09 DIAGNOSIS — Z1231 Encounter for screening mammogram for malignant neoplasm of breast: Secondary | ICD-10-CM

## 2013-05-03 ENCOUNTER — Other Ambulatory Visit: Payer: Self-pay

## 2013-05-03 DIAGNOSIS — Z1231 Encounter for screening mammogram for malignant neoplasm of breast: Secondary | ICD-10-CM

## 2013-05-21 ENCOUNTER — Ambulatory Visit: Payer: BC Managed Care – PPO

## 2013-05-21 ENCOUNTER — Ambulatory Visit
Admission: RE | Admit: 2013-05-21 | Discharge: 2013-05-21 | Disposition: A | Discharge status: Home / Self Care | Payer: BC Managed Care – PPO | Source: Ambulatory Visit

## 2013-05-21 DIAGNOSIS — Z1231 Encounter for screening mammogram for malignant neoplasm of breast: Secondary | ICD-10-CM

## 2013-06-26 ENCOUNTER — Emergency Department (HOSPITAL_COMMUNITY): Payer: BC Managed Care – PPO

## 2013-06-26 ENCOUNTER — Encounter (HOSPITAL_COMMUNITY): Payer: Self-pay | Admitting: Emergency Medicine

## 2013-06-26 ENCOUNTER — Emergency Department (HOSPITAL_COMMUNITY)
Admission: EM | Admit: 2013-06-26 | Discharge: 2013-06-26 | Disposition: A | Discharge status: Home / Self Care | Payer: BC Managed Care – PPO | Attending: Emergency Medicine | Admitting: Emergency Medicine

## 2013-06-26 DIAGNOSIS — F329 Major depressive disorder, single episode, unspecified: Secondary | ICD-10-CM | POA: Insufficient documentation

## 2013-06-26 DIAGNOSIS — F3289 Other specified depressive episodes: Secondary | ICD-10-CM | POA: Insufficient documentation

## 2013-06-26 DIAGNOSIS — R1013 Epigastric pain: Secondary | ICD-10-CM | POA: Insufficient documentation

## 2013-06-26 DIAGNOSIS — R109 Unspecified abdominal pain: Secondary | ICD-10-CM

## 2013-06-26 DIAGNOSIS — G8929 Other chronic pain: Secondary | ICD-10-CM | POA: Insufficient documentation

## 2013-06-26 DIAGNOSIS — Z87442 Personal history of urinary calculi: Secondary | ICD-10-CM | POA: Insufficient documentation

## 2013-06-26 DIAGNOSIS — Z79899 Other long term (current) drug therapy: Secondary | ICD-10-CM | POA: Insufficient documentation

## 2013-06-26 DIAGNOSIS — Z8679 Personal history of other diseases of the circulatory system: Secondary | ICD-10-CM | POA: Insufficient documentation

## 2013-06-26 DIAGNOSIS — R55 Syncope and collapse: Secondary | ICD-10-CM

## 2013-06-26 DIAGNOSIS — F17200 Nicotine dependence, unspecified, uncomplicated: Secondary | ICD-10-CM | POA: Insufficient documentation

## 2013-06-26 DIAGNOSIS — F410 Panic disorder [episodic paroxysmal anxiety] without agoraphobia: Secondary | ICD-10-CM | POA: Insufficient documentation

## 2013-06-26 HISTORY — DX: Calculus of kidney: N20.0

## 2013-06-26 HISTORY — DX: Cardiac arrhythmia, unspecified: I49.9

## 2013-06-26 LAB — TROPONIN I: Troponin I: <0.3 ng/mL (ref <0.30)

## 2013-06-26 LAB — CBC WITH DIFFERENTIAL/PLATELET
Basophils Absolute: 0 10*3/uL (ref 0.0–0.1)
Basophils Relative: 1 % (ref 0–1)
Eosinophils Absolute: 0.1 10*3/uL (ref 0.0–0.7)
Eosinophils Relative: 1 % (ref 0–5)
HCT: 44.5 % (ref 36.0–46.0)
HEMOGLOBIN: 14.6 g/dL (ref 12.0–15.0)
LYMPHS ABS: 2.3 10*3/uL (ref 0.7–4.0)
Lymphocytes Relative: 39 % (ref 12–46)
MCH: 31.9 pg (ref 26.0–34.0)
MCHC: 32.8 g/dL (ref 30.0–36.0)
MCV: 97.4 fL (ref 78.0–100.0)
MONOS PCT: 6 % (ref 3–12)
Monocytes Absolute: 0.4 10*3/uL (ref 0.1–1.0)
Neutro Abs: 3.1 10*3/uL (ref 1.7–7.7)
Neutrophils Relative %: 53 % (ref 43–77)
Platelets: 215 10*3/uL (ref 150–400)
RBC: 4.57 MIL/uL (ref 3.87–5.11)
RDW: 13.4 % (ref 11.5–15.5)
WBC: 5.8 10*3/uL (ref 4.0–10.5)

## 2013-06-26 LAB — COMPREHENSIVE METABOLIC PANEL
ALK PHOS: 66 U/L (ref 39–117)
ALT: 17 U/L (ref 0–35)
AST: 23 U/L (ref 0–37)
Albumin: 3.9 g/dL (ref 3.5–5.2)
BILIRUBIN TOTAL: 0.3 mg/dL (ref 0.3–1.2)
BUN: 18 mg/dL (ref 6–23)
CO2: 23 mEq/L (ref 19–32)
Calcium: 9.2 mg/dL (ref 8.4–10.5)
Chloride: 103 mEq/L (ref 96–112)
Creatinine, Ser: 0.62 mg/dL (ref 0.50–1.10)
GLUCOSE: 92 mg/dL (ref 70–99)
POTASSIUM: 4 meq/L (ref 3.7–5.3)
Sodium: 140 mEq/L (ref 137–147)
Total Protein: 6.8 g/dL (ref 6.0–8.3)

## 2013-06-26 LAB — URINALYSIS, ROUTINE W REFLEX MICROSCOPIC
Bilirubin Urine: NEGATIVE
Glucose, UA: NEGATIVE mg/dL
Hgb urine dipstick: NEGATIVE
Ketones, ur: NEGATIVE mg/dL
Leukocytes, UA: NEGATIVE
Nitrite: NEGATIVE
PH: 8 (ref 5.0–8.0)
Protein, ur: NEGATIVE mg/dL
SPECIFIC GRAVITY, URINE: 1.014 (ref 1.005–1.030)
Urobilinogen, UA: 0.2 mg/dL (ref 0.0–1.0)

## 2013-06-26 LAB — LIPASE, BLOOD: LIPASE: 60 U/L — AB (ref 11–59)

## 2013-06-26 LAB — MAGNESIUM: Magnesium: 2.2 mg/dL (ref 1.5–2.5)

## 2013-06-26 NOTE — ED Notes (Signed)
Pt at work today- got very hot and felt like she was going to pass out. Pt states she felt like she couldn't breathe. No chest pain. Pt complaining of upper abd pain 7/10. Denies nausea, denies chest pain.  Pt states she feels like she is breathing much better now. EMS BP 170/100, EKG from EMS shows bigeminy PVCs.

## 2013-06-26 NOTE — Discharge Instructions (Signed)
°Emergency Department Resource Guide °1) Find a Doctor and Pay Out of Pocket °Although you won't have to find out who is covered by your insurance plan, it is a good idea to ask around and get recommendations. You will then need to call the office and see if the doctor you have chosen will accept you as a new patient and what types of options they offer for patients who are self-pay. Some doctors offer discounts or will set up payment plans for their patients who do not have insurance, but you will need to ask so you aren't surprised when you get to your appointment. ° °2) Contact Your Local Health Department °Not all health departments have doctors that can see patients for sick visits, but many do, so it is worth a call to see if yours does. If you don't know where your local health department is, you can check in your phone book. The CDC also has a tool to help you locate your state's health department, and many state websites also have listings of all of their local health departments. ° °3) Find a Walk-in Clinic °If your illness is not likely to be very severe or complicated, you may want to try a walk in clinic. These are popping up all over the country in pharmacies, drugstores, and shopping centers. They're usually staffed by nurse practitioners or physician assistants that have been trained to treat common illnesses and complaints. They're usually fairly quick and inexpensive. However, if you have serious medical issues or chronic medical problems, these are probably not your best option. ° °No Primary Care Doctor: °- Call [facility] at  832-8000 - they can help you locate a primary care doctor that  accepts your insurance, provides certain services, etc. °- Physician Referral Service- 1-800-533-3463 ° °Chronic Pain Problems: °Organization         Address  Phone   Notes  °[facility]  (336) 297-2271 Patients need to be referred by their primary care doctor.  ° °Medication  Assistance: °Organization         Address  Phone   Notes  °[place] [facility] [address] °[place], [place] [address] (336) 641-8030 --Must be a resident of [place] °-- Must have NO insurance coverage whatsoever (no Medicaid/ Medicare, etc.) °-- The pt. MUST have a primary care doctor that directs their care regularly and follows them in the community °  °[facility]  (866) 331-1348   °[employer]  (888) 892-1162   ° °Agencies that provide inexpensive medical care: °Organization         Address  Phone   Notes  °[facility]  (336) 832-8035   °[facility]    (336) 832-7272   °[facility] [address] °[place], [place] [address] (336) 832-4777   °[facility] [address], °[place] (336) 271-4999   °[facility]    (336) 373-0678   °[facility]    (336) 272-1050   °[facility] ° [address], [place] Phone:  (336) 832-4444, Fax:  (336) 832-4440 Hours of Operation:  9 am - 6 pm, M-F.  Also accepts Medicaid/Medicare and self-pay.  °[facility] ° [address], [place] Phone: (336) 832-3150, Fax: (336) [phone]. Hours of Operation:  8:30 am - 5:30 pm, M-F.  Also accepts Medicaid and self-pay.  °[facility] [place] [address], [place] Phone: (336) 878-6027   °[facility] [address], [place], [place] (336)723-1848, Ext. 123 Mondays & Thursdays: 7-9 AM.  First 15 patients are seen on a first come, first serve basis. °  ° °Medicaid-accepting [place] Providers: ° °Organization         Address  Phone   Notes  °[facility] [address], [place] (336) 641-2100 Also accepts self-pay patients.  °[facility] [address], [place] ° (336) 856-9996   °[facility] [address], [place]  (336) 288-8857   °[facility] [address] [place] [address], [place] (336) 299-7000   °Veita Bland [address], [place]  ° (336) 373-1557 Only accepts [place] Access Medicaid patients after they have their name applied to their card.  ° °Self-Pay (no insurance) in [place]: ° °Organization         Address  Phone   Notes  °Sickle Cell Patients, [facility] [address], [place] (336) 832-1970   °[facility] [address], [place] (336) 832-4400   °[facility] ° [address], [place] (336) 992-4800   °[facility]/Dr. Osei-Bonsu ° [address] [place] [address], [place] or [address], [place] (336) 841-8500 Phone number for both [place] and [place] locations is the same.  °[facility] [address], [place] (336) 299-0000   °[facility] [address] [place] [address], [place] or [address] (336) 852-7530 °(336) 878-2260   °[facility] [address], [place] (336) 350-1642, phone; (336) 294-5005, fax Sees patients 1st and 3rd Saturday of every month.  Must not qualify for public or private insurance (i.e. Medicaid, Medicare, [place] Health Choice, Veterans' Benefits) • Household income should be no more than 200% of the poverty level •The clinic cannot treat you if you are pregnant or think you are pregnant • Sexually transmitted diseases are not treated at the clinic.  ° ° °Dental Care: °Organization         Address  Phone  Notes  °[place] [facility] [address], [place] (336) 641-6152 Accepts children up to age 21 who are enrolled in Medicaid or [place] Health Choice; pregnant women with a Medicaid card; and children who have applied for Medicaid or [place] Health Choice, but were declined, whose parents can pay a reduced fee at time of service.  °[place]  [facility] [place]  [address], [place] (336) 641-7733 Accepts children up to age 21 who are enrolled in Medicaid or [place] Health Choice; pregnant women with a Medicaid card; and children who have applied for Medicaid or [place] Health Choice, but were declined, whose parents can pay a reduced fee at time of service.  °[facility] ° [address], [place] (336) 641-4533 Patients are seen by appointment only. Walk-ins are not accepted. [facility] will see patients 18 years of age and older. °Monday - Tuesday (8am-5pm) °Most Wednesdays (8:30-5pm) °$30 per visit, cash only  °[facility] ° [address], [place] (336) 641-4533 Patients are seen by appointment only. Walk-ins are not accepted. [facility] will see patients 18 years of age and older. °One   Wednesday Evening (Monthly: Volunteer Based).  $30 per visit, cash only  °[facility]  (919) 537-3737 for adults; Children under age 4, call Graduate Pediatric Dentistry at ([phone]) [phone]. Children aged 4-14, please call ([phone]) [phone] to request a pediatric application. ° Dental services are provided in all areas of dental care including fillings, crowns and bridges, complete and partial dentures, implants, gum treatment, root canals, and extractions. Preventive care is also provided. Treatment is provided to both adults and children. °Patients are selected via a lottery and there is often a waiting list. °  °[facility] [address], °[place] ° (336) 763-8833 [email or web address] °  °[facility] [address], [place], [place] (336)723-1848, Ext. 123 Second and Fourth Thursday of each month, opens at 6:30 AM; Clinic ends at 9 AM.  Patients are seen on a first-come first-served basis, and a limited number are seen during each clinic.  ° °[facility] ° [address], [place], [place] (336) 723-7904    Eligibility Requirements °You must have lived in [place], [place], or [place] counties for at least the last three months. °  You cannot be eligible for state or federal sponsored healthcare insurance, including Veterans Administration, Medicaid, or Medicare. °  You generally cannot be eligible for healthcare insurance through your employer.  °  How to apply: °Eligibility screenings are held every Tuesday and Wednesday afternoon from 1:00 pm until 4:00 pm. You do not need an appointment for the interview!  °[facility] [address], [place], [place] 336-631-2330   °[facility]  336-342-8273   °[facility]  336-703-3100   °[facility]  336-570-6415   ° °Behavioral Health Resources in the Community: °Intensive Outpatient Programs °Organization         Address  Phone  Notes  °[facility] [address], [place], [place] 336-878-6098   °[facility] [address], [place], [place] 336-832-9800   °[facility] [address], [place], [place] ° 336-882-2125   °[facility] [address],  °[place], [place] 1-800-853-5163 or 336-641-4981   °Substance Abuse Resources °Organization         Address  Phone  Notes  °[facility]  336-882-2125   °[facility]  336-784-9470   °[facility]  336-285-9073   °[facility]  336-845-3988   °Residential & Outpatient Substance Abuse Program  1-800-659-3381   °Psychological Services °Organization         Address  Phone  Notes  °[facility]  336- 832-9600   °[facility]  336- 378-7881   °[facility] [address], [place] 1-800-853-5163 or 336-641-4981   ° °Mobile Crisis Teams °Organization         Address  Phone  Notes  °[facility], Mobile Crisis Care Unit  1-877-626-1772   °Assertive °[facility] ° [address]  [place], [place] 336-834-9664   °Sharon DeEsch [address] °[place] 336-554-5454   ° °Self-Help/Support Groups °Organization         Address  Phone             Notes  °[facility]  - variety of support groups  336- 373-1402 Call for more information  °Narcotics Anonymous (NA), [facility] [address], °[place]  2 meetings at this location  ° °  Residential Treatment Programs Organization         Address  Phone  Notes  [facility] [address],    [place]  1-610-960-45401-(814) 026-8229   [facility]  [address], [place] [phone], [place], [place] [phone]   [facility] [address], [place] [phone] Admissions: 8am-3pm M-F  [facility] [address]    [place], [place] 295-284-1324([phone])[phone]   [facility] [address], [place], [place] [phone]   [facility] [address]  [place], [place] [phone]   [facility] Intensive Outpatient [address], [place], [place] [phone](757)[phone]   [facility] ([facility] [address]  [place], [place] [phone]-(435) 623-1250 or (319) [phone]   [facility] [address] [place], [place] 323-557-3220(724) [phone] Accepts Medicaid  [facility] [address]  [place] [phone]-(604)[phone] Substance Abuse/Addiction Treatment   [facility] Organization         Address  Phone  Notes  [facility]  703-556-4491(888) 772-573-8220   Angie FavaJulie Brannon, PhD [address], [address] [place], [place]   ([phone]) [phone](336) (215) 690-2793 or [phone](336) ([phone]   [facility]   [address] [place], [place] ([phone]) [phone](336) ([phone]   [facility] [address], [place], [place] ([phone]) [phone](336) [phone] Insurance/Medicaid/sponsorship through [facility] [address]                                    [place], [place] (306) [phone](336) [phone] Therapy/tele-psych/case    [facility] [address]   [place], [place] [phone](336) [phone]    Dr. Lolly MustacheArfeen  [phone](336) [phone]   [facility] [place]  [employer] [facility] 1) [address], [place] 2) [address], [place] 3)  [address], [place] [phone](336) [phone](336) [phone]  (717) [phone](336) [phone]   [facility] Child Abuse Hotline 365-433-4663(336) 215-362-7679 or 409-227-8054(336) [phone] (After Hours)      Eat a bland diet, avoiding greasy, fatty, fried foods, as well as spicy and acidic foods or beverages.  Avoid eating within the hour or 2 before going to bed or laying down.  Also avoid teas, colas, coffee, chocolate, pepermint and spearment.  Take over the counter pepcid, one tablet by mouth twice a day, for the next 2 to 3 weeks.  May also take over the counter maalox/mylanta, as directed on packaging, as needed for discomfort. Try to increase your fiber intake. Take your usual prescriptions as previously directed.  Call your regular medical doctor today to schedule a follow up appointment in the next 2 days. Call the GI doctor today to schedule a follow up appointment within the next week.  Return to the Emergency Department immediately if worsening.

## 2013-06-26 NOTE — ED Provider Notes (Signed)
CSN: [id number]     Arrival date & time 06/26/13  4098 History   First MD Initiated Contact with Patient 06/26/13 1105     Chief Complaint  Patient presents with  . Near Syncope      HPI Pt was seen at 1125. Per pt, c/o sudden onset and gradual improvement of one episode of near syncope that occurred while at work PTA. Pt states she was sitting at her desk doing paperwork when she felt mid-epigastric "sharp" abd pain, followed by "getting hot," "feeling like I was going to pass out," and "feeling like I couldn't breathe." States she "feels better" since arrival to the ED. Endorses significant anxiety and "panic attacks" due "my divorce and child custody battle" for the past several months. States her PMD has started her on anti-depressant due to her anxiety symptoms, which include "hyperventilating." Pt also endorses several year hx of upper abd pain and feeling "bloated" after eating; has not f/u with PMD or GI MD for same.  Denies CP/palpitations, no cough, no N/V/D, no back pain, no fevers, no focal motor weakness, no tingling/numbness in extremities, no facial droop, no slurred speech.     Past Medical History  Diagnosis Date  . Chronic back pain     hx of childhood scoliosis, has stabilization rods in back  . Tobacco dependence   . Depression   . Anxiety   . Kidney stones   . Irregular heart beats    Past Surgical History  Procedure Laterality Date  . Cholecystectomy  2009  . Total abdominal hysterectomy w/ bilateral salpingoophorectomy  2007  . Cesarean section  2001  . Scoliosis stabilization  1979  . Spinal fusion    . Oophorectomy     Family History  Problem Relation Age of Onset  . Hypertension Mother   . Thyroid disease Mother   . Hypertension Father   . Polymyalgia rheumatica Father   . Hypertension Brother    History  Substance Use Topics  . Smoking status: Current Every Day Smoker -- 1.00 packs/day for 20 years    Types: Cigarettes  . Smokeless tobacco: Not  on file  . Alcohol Use: Yes    Review of Systems ROS: Statement: All systems negative except as marked or noted in the HPI; Constitutional: Negative for fever and chills. ; ; Eyes: Negative for eye pain, redness and discharge. ; ; ENMT: Negative for ear pain, hoarseness, nasal congestion, sinus pressure and sore throat. ; ; Cardiovascular: Negative for chest pain, palpitations, diaphoresis, and peripheral edema. ; ; Respiratory: +SOB. Negative for cough, wheezing and stridor. ; ; Gastrointestinal: +abd pain. Negative for nausea, vomiting, diarrhea, blood in stool, hematemesis, jaundice and rectal bleeding. . ; ; Genitourinary: Negative for dysuria, flank pain and hematuria. ; ; Musculoskeletal: Negative for back pain and neck pain. Negative for swelling and trauma.; ; Skin: Negative for pruritus, rash, abrasions, blisters, bruising and skin lesion.; ; Neuro: +lightheadedness/near syncope. Negative for headache and neck stiffness. Negative for weakness, altered level of consciousness , altered mental status, extremity weakness, paresthesias, involuntary movement, seizure and syncope.; Psych:  +anxiety. No SI, no SA, no HI, no hallucinations.       Allergies  Review of patient's allergies indicates no known allergies.  Home Medications   Prior to Admission medications   Medication Sig Start Date End Date Taking? Authorizing Provider  ALPRAZolam (XANAX) 0.25 MG tablet Take 0.125 mg by mouth every 8 (eight) hours as needed for anxiety.    Yes  Historical Provider, MD  estradiol (ESTRACE) 1 MG tablet Take 1 mg by mouth 2 (two) times daily. Take 1 in AM and 1/2 in PM    Yes Historical Provider, MD  Multiple Vitamins-Calcium (ONE-A-DAY WOMENS PO) Take 1 tablet by mouth daily.   Yes Historical Provider, MD  sertraline (ZOLOFT) 50 MG tablet Take 75 mg by mouth daily.   Yes Historical Provider, MD  venlafaxine XR (EFFEXOR-XR) 150 MG 24 hr capsule Take 150 mg by mouth daily with breakfast.   Yes Historical  Provider, MD  vitamin C (ASCORBIC ACID) 500 MG tablet Take 500 mg by mouth daily.   Yes Historical Provider, MD   BP 131/55  Pulse 76  Temp(Src) 97.7 F (36.5 C) (Oral)  Resp 16  Ht 5\' 6"  (1.676 m)  Wt 150 lb (68.04 kg)  BMI 24.22 kg/m2  SpO2 99% Physical Exam 1130: Physical examination:  Nursing notes reviewed; Vital signs and O2 SAT reviewed;  Constitutional: Well developed, Well nourished, Well hydrated, In no acute distress; Head:  Normocephalic, atraumatic; Eyes: EOMI, PERRL, No scleral icterus; ENMT: Mouth and pharynx normal, Mucous membranes moist; Neck: Supple, Full range of motion, No lymphadenopathy; Cardiovascular: Regular rate and rhythm, No murmur, rub, or gallop; Respiratory: Breath sounds clear & equal bilaterally, No rales, rhonchi, wheezes.  Speaking full sentences with ease, Normal respiratory effort/excursion; Chest: Nontender, Movement normal; Abdomen: Soft, +mild mid-epigastric area tenderness to palp. No rebound or guarding. Nondistended, Normal bowel sounds; Genitourinary: No CVA tenderness; Extremities: Pulses normal, No tenderness, No edema, No calf edema or asymmetry.; Neuro: AA&Ox3, Major CN grossly intact.  Speech clear. No gross focal motor or sensory deficits in extremities.; Skin: Color normal, Warm, Dry.; Psych:  Anxious.    ED Course  Procedures    EKG Interpretation  Date/Time:  Wednesday June 26 2013 10:10:51 EDT Ventricular Rate:  95 PR Interval:  150 QRS Duration: 80 QT Interval:  397 QTC Calculation: 499 R Axis:   69 Text Interpretation:  Sinus rhythm Premature ventricular complexes  Anteroseptal infarct, age indeterminate No old tracing to compare  Confirmed by [facility]  MD, Nithin Demeo [phone]([id number]) on 06/26/2013 12:36:00 PM      EKG Interpretation  Date/Time:  Wednesday June 26 2013 12:43:23 EDT Ventricular Rate:  81 PR Interval:  165 QRS Duration: 81 QT Interval:  400 QTC Calculation: 464 R Axis:   68 Text Interpretation:  Sinus rhythm  Multiple ventricular premature complexes Anteroseptal infarct, age indeterminate Since last tracing of earlier today No significant change was found Confirmed by [facility]  MD, Nicholos JohnsKATHLEEN [phone]) on 06/26/2013 12:48:36 PM          MDM  MDM Reviewed: previous chart, nursing note and vitals Reviewed previous: labs Interpretation: labs, ECG and x-ray    Results for orders placed during the hospital encounter of 06/26/13  CBC WITH DIFFERENTIAL      Result Value Ref Range   WBC 5.8  4.0 - 10.5 K/uL   RBC 4.57  3.87 - 5.11 MIL/uL   Hemoglobin 14.6  12.0 - 15.0 g/dL   HCT 57.844.5  46.936.0 - 62.946.0 %   MCV 97.4  78.0 - 100.0 fL   MCH 31.9  26.0 - 34.0 pg   MCHC 32.8  30.0 - 36.0 g/dL   RDW 52.813.4  41.311.5 - 24.415.5 %   Platelets 215  150 - 400 K/uL   Neutrophils Relative % 53  43 - 77 %   Neutro Abs 3.1  1.7 - 7.7 K/uL  Lymphocytes Relative 39  12 - 46 %   Lymphs Abs 2.3  0.7 - 4.0 K/uL   Monocytes Relative 6  3 - 12 %   Monocytes Absolute 0.4  0.1 - 1.0 K/uL   Eosinophils Relative 1  0 - 5 %   Eosinophils Absolute 0.1  0.0 - 0.7 K/uL   Basophils Relative 1  0 - 1 %   Basophils Absolute 0.0  0.0 - 0.1 K/uL  COMPREHENSIVE METABOLIC PANEL      Result Value Ref Range   Sodium 140  137 - 147 mEq/L   Potassium 4.0  3.7 - 5.3 mEq/L   Chloride 103  96 - 112 mEq/L   CO2 23  19 - 32 mEq/L   Glucose, Bld 92  70 - 99 mg/dL   BUN 18  6 - 23 mg/dL   Creatinine, Ser 6.57  0.50 - 1.10 mg/dL   Calcium 9.2  8.4 - 84.6 mg/dL   Total Protein 6.8  6.0 - 8.3 g/dL   Albumin 3.9  3.5 - 5.2 g/dL   AST 23  0 - 37 U/L   ALT 17  0 - 35 U/L   Alkaline Phosphatase 66  39 - 117 U/L   Total Bilirubin 0.3  0.3 - 1.2 mg/dL   GFR calc non Af Amer >90  >90 mL/min   GFR calc Af Amer >90  >90 mL/min  LIPASE, BLOOD      Result Value Ref Range   Lipase 60 (*) 11 - 59 U/L  TROPONIN I      Result Value Ref Range   Troponin I <0.30  <0.30 ng/mL  URINALYSIS, ROUTINE W REFLEX MICROSCOPIC      Result Value Ref Range    Color, Urine YELLOW  YELLOW   APPearance CLOUDY (*) CLEAR   Specific Gravity, Urine 1.014  1.005 - 1.030   pH 8.0  5.0 - 8.0   Glucose, UA NEGATIVE  NEGATIVE mg/dL   Hgb urine dipstick NEGATIVE  NEGATIVE   Bilirubin Urine NEGATIVE  NEGATIVE   Ketones, ur NEGATIVE  NEGATIVE mg/dL   Protein, ur NEGATIVE  NEGATIVE mg/dL   Urobilinogen, UA 0.2  0.0 - 1.0 mg/dL   Nitrite NEGATIVE  NEGATIVE   Leukocytes, UA NEGATIVE  NEGATIVE  MAGNESIUM      Result Value Ref Range   Magnesium 2.2  1.5 - 2.5 mg/dL   Dg Abd Acute W/chest 06/26/2013   CLINICAL DATA:  Upper abdominal pain.  Syncope.  EXAM: ACUTE ABDOMEN SERIES (ABDOMEN 2 VIEW & CHEST 1 VIEW)  COMPARISON:  08/13/2007  FINDINGS: Small right an larger caliber Harrington rods noted. There is 41 degrees of dextroconvex thoracic scoliosis as measured between T4 and T11.  The lungs appear clear. Cardiac and mediastinal margins appear normal. Lumbar degenerative disc disease noted.  Prominent stool throughout the colon favors constipation. No significant abnormal air-fluid levels. No significantly dilated small bowel.  IMPRESSION: 1.  Prominent stool throughout the colon favors constipation. 2. Thoracolumbar scoliosis with rod fixation. 3. Lumbar degenerative disc disease.   Electronically Signed   By: Herbie Baltimore M.D.   On: 06/26/2013 12:25    1250:  Pt has tol PO well while in the ED without N/V. Has ambulated with steady/upright gait, easy resps, NAD. Denies CP/SOB/palpitations.  Pt is not orthostatic. PVC's on EKG, pt asymptomatic and is already aware she "has irregular heart beats." Doubt PE as cause for symptoms with low risk Wells.  Pt states  she continues to "feel better" and wants to go home. Tx symptomatically at this time. Dx and testing d/w pt and family.  Questions answered.  Verb understanding, agreeable to d/c home with outpt f/u.    Laray AngerKathleen M Corinne Goucher, DO 06/29/13 [phone]

## 2013-06-27 LAB — URINE CULTURE
Colony Count: NO GROWTH
Culture: NO GROWTH

## 2013-07-15 ENCOUNTER — Other Ambulatory Visit: Payer: Self-pay | Admitting: Gastroenterology

## 2014-06-11 ENCOUNTER — Other Ambulatory Visit: Payer: Self-pay

## 2014-06-11 DIAGNOSIS — Z1231 Encounter for screening mammogram for malignant neoplasm of breast: Secondary | ICD-10-CM

## 2014-06-19 ENCOUNTER — Ambulatory Visit
Admission: RE | Admit: 2014-06-19 | Discharge: 2014-06-19 | Disposition: A | Discharge status: Home / Self Care | Payer: 59 | Source: Ambulatory Visit

## 2014-06-19 ENCOUNTER — Ambulatory Visit: Payer: Self-pay

## 2014-06-19 DIAGNOSIS — Z1231 Encounter for screening mammogram for malignant neoplasm of breast: Secondary | ICD-10-CM

## 2015-06-02 ENCOUNTER — Other Ambulatory Visit: Payer: Self-pay

## 2015-06-02 DIAGNOSIS — Z1231 Encounter for screening mammogram for malignant neoplasm of breast: Secondary | ICD-10-CM

## 2015-06-22 ENCOUNTER — Ambulatory Visit
Admission: RE | Admit: 2015-06-22 | Discharge: 2015-06-22 | Disposition: A | Discharge status: Home / Self Care | Payer: Managed Care, Other (non HMO) | Source: Ambulatory Visit

## 2015-06-22 DIAGNOSIS — Z1231 Encounter for screening mammogram for malignant neoplasm of breast: Secondary | ICD-10-CM

## 2016-03-07 DIAGNOSIS — I493 Ventricular premature depolarization: Secondary | ICD-10-CM

## 2016-03-07 HISTORY — DX: Ventricular premature depolarization: I49.3

## 2016-04-26 DIAGNOSIS — M5137 Other intervertebral disc degeneration, lumbosacral region: Secondary | ICD-10-CM | POA: Insufficient documentation

## 2016-04-26 DIAGNOSIS — M51379 Other intervertebral disc degeneration, lumbosacral region without mention of lumbar back pain or lower extremity pain: Secondary | ICD-10-CM | POA: Insufficient documentation

## 2016-04-26 DIAGNOSIS — Z981 Arthrodesis status: Secondary | ICD-10-CM | POA: Insufficient documentation

## 2016-07-05 ENCOUNTER — Other Ambulatory Visit: Payer: Self-pay | Admitting: Internal Medicine

## 2016-07-05 DIAGNOSIS — Z1231 Encounter for screening mammogram for malignant neoplasm of breast: Secondary | ICD-10-CM

## 2016-07-22 ENCOUNTER — Ambulatory Visit
Admission: RE | Admit: 2016-07-22 | Discharge: 2016-07-22 | Disposition: A | Discharge status: Home / Self Care | Payer: 59 | Source: Ambulatory Visit | Attending: Internal Medicine | Admitting: Internal Medicine

## 2016-07-22 DIAGNOSIS — Z1231 Encounter for screening mammogram for malignant neoplasm of breast: Secondary | ICD-10-CM

## 2016-08-12 DIAGNOSIS — M47816 Spondylosis without myelopathy or radiculopathy, lumbar region: Secondary | ICD-10-CM | POA: Insufficient documentation

## 2016-12-01 ENCOUNTER — Encounter: Payer: Self-pay | Admitting: Cardiology

## 2016-12-01 ENCOUNTER — Ambulatory Visit (INDEPENDENT_AMBULATORY_CARE_PROVIDER_SITE_OTHER): Payer: 59 | Admitting: Cardiology

## 2016-12-01 DIAGNOSIS — I493 Ventricular premature depolarization: Secondary | ICD-10-CM

## 2016-12-01 DIAGNOSIS — R9431 Abnormal electrocardiogram [ECG] [EKG]: Secondary | ICD-10-CM | POA: Diagnosis not present

## 2016-12-01 DIAGNOSIS — F17210 Nicotine dependence, cigarettes, uncomplicated: Secondary | ICD-10-CM

## 2016-12-01 NOTE — Progress Notes (Signed)
Cardiology Office Note:    Date:  12/01/2016   ID:  Katherine Brock, DOB [birth year], MRN [id number]  PCP:  Merri Brunette, MD  Cardiologist:  Garwin Brothers, MD   Referring MD: Merri Brunette, MD    ASSESSMENT:    1. Abnormal EKG   2. Frequent unifocal PVCs   3. Cigarette smoker    PLAN:    In order of problems listed above:   1. Primary prevention stressed to the patient. Importance of compliance with diet and medications stressed. In view of her significant abnormality on the EKG and multiple PVCs I will obtain a exercise stress echo. This will help me assess cardiac anatomy and also response to exercise. She will be seen in follow-up appointment in 3 months or earlier if she has any concerns. Noteworthy is the fact that she is asymptomatic.  I spent 5 minutes with the patient discussing solely about smoking. Smoking cessation was counseled. I suggested to the patient also different medications and pharmacological interventions. Patient is keen to try stopping on its own at this time. He will get back to me if he needs any further assistance in this matter.  Medication Adjustments/Labs and Tests Ordered: Current medicines are reviewed at length with the patient today.  Concerns regarding medicines are outlined above.  No orders of the defined types were placed in this encounter.  No orders of the defined types were placed in this encounter.    History of Present Illness:    Katherine Brock is a 51 y.o. female who is being seen today for the evaluation of Abnormal EKG at the request of Merri Brunette, MD. Patient is a pleasant 51 year old female. She has past medical history of multiple orthopedic surgeries on the back. She denies any history of hypertension diabetes mellitus or any dyslipidemia. She is a very active lady and in spite of her back problems she was on the treadmill on a regular basis. At the time of my evaluation she is alert awake oriented and in no  distress.  Past Medical History:  Diagnosis Date  . Anxiety   . Chronic back pain    hx of childhood scoliosis, has stabilization rods in back  . Depression   . Irregular heart beats   . Kidney stones   . Tobacco dependence     Past Surgical History:  Procedure Laterality Date  . CESAREAN SECTION  2001  . CHOLECYSTECTOMY  2009  . OOPHORECTOMY    . scoliosis stabilization  1979  . SPINAL FUSION    . TOTAL ABDOMINAL HYSTERECTOMY W/ BILATERAL SALPINGOOPHORECTOMY  2007    Current Medications: Current Meds  Medication Sig  . ALPRAZolam (XANAX) 0.25 MG tablet Take 0.25 mg by mouth every 8 (eight) hours as needed for anxiety.   . Multiple Vitamins-Calcium (ONE-A-DAY WOMENS PO) Take 1 tablet by mouth daily.  . sertraline (ZOLOFT) 50 MG tablet Take 75 mg by mouth daily.  Marland Kitchen venlafaxine XR (EFFEXOR-XR) 150 MG 24 hr capsule Take 150 mg by mouth daily with breakfast.  . vitamin C (ASCORBIC ACID) 500 MG tablet Take 500 mg by mouth daily.     Allergies:   Patient has no known allergies.   Social History   Social History  . Marital status: Married    Spouse name: N/A  . Number of children: N/A  . Years of education: N/A   Social History Main Topics  . Smoking status: Current Every Day Smoker    Packs/day: 0.25  Years: 20.00    Types: Cigarettes  . Smokeless tobacco: Never Used  . Alcohol use Yes  . Drug use: No  . Sexual activity: Not Asked   Other Topics Concern  . None   Social History Narrative  . None     Family History: The patient's family history includes Hypertension in her brother, father, and mother; Polymyalgia rheumatica in her father; Thyroid disease in her mother.  ROS:   Please see the history of present illness.    All other systems reviewed and are negative.  EKGs/Labs/Other Studies Reviewed:    The following studies were reviewed today: I reviewed the office notes, EKG and lab work from primary care physician at extensive length and discussed  the findings with her.   Recent Labs: No results found for requested labs within last 8760 hours.  Recent Lipid Panel No results found for: CHOL, TRIG, HDL, CHOLHDL, VLDL, LDLCALC, LDLDIRECT  Physical Exam:    VS:  BP 122/70   Pulse 74   Ht 5' 6.5" (1.689 m)   Wt 166 lb 1.9 oz (75.4 kg)   SpO2 98%   BMI 26.41 kg/m     Wt Readings from Last 3 Encounters:  12/01/16 166 lb 1.9 oz (75.4 kg)  06/26/13 150 lb (68 kg)  01/16/11 158 lb (71.7 kg)     GEN: Patient is in no acute distress HEENT: Normal NECK: No JVD; No carotid bruits LYMPHATICS: No lymphadenopathy CARDIAC: S1 S2 regular, 2/6 systolic murmur at the apex. RESPIRATORY:  Clear to auscultation without rales, wheezing or rhonchi  ABDOMEN: Soft, non-tender, non-distended MUSCULOSKELETAL:  No edema; No deformity  SKIN: Warm and dry NEUROLOGIC:  Alert and oriented x 3 PSYCHIATRIC:  Normal affect    Signed, Garwin Brothers, MD  12/01/2016 9:32 AM    [facility]

## 2016-12-01 NOTE — Patient Instructions (Signed)
Medication Instructions:   Your physician recommends that you continue on your current medications as directed. Please refer to the Current Medication list given to you today.   Labwork:  NONE  Testing/Procedures:  Your physician has requested that you have a stress echocardiogram. For further information please visit [email or web address] Please follow instruction sheet as given.    Follow-Up:  Your physician recommends that you schedule a follow-up appointment in: 3 months with Dr. Tomie China   Any Other Special Instructions Will Be Listed Below (If Applicable).     If you need a refill on your cardiac medications before your next appointment, please call your pharmacy.

## 2016-12-28 ENCOUNTER — Other Ambulatory Visit (HOSPITAL_BASED_OUTPATIENT_CLINIC_OR_DEPARTMENT_OTHER): Payer: 59

## 2017-01-04 ENCOUNTER — Ambulatory Visit: Payer: 59 | Admitting: Cardiology

## 2017-01-12 ENCOUNTER — Other Ambulatory Visit (HOSPITAL_BASED_OUTPATIENT_CLINIC_OR_DEPARTMENT_OTHER): Payer: 59

## 2017-02-02 ENCOUNTER — Telehealth (HOSPITAL_COMMUNITY): Payer: Self-pay | Admitting: *Deleted

## 2017-02-02 NOTE — Telephone Encounter (Signed)
Left message on voicemail per DPR in reference to upcoming appointment scheduled on 02/08/17 at 2:30 with detailed instructions given per Stress Test Requisition Sheet for the test. LM to arrive 30 minutes early, and that it is imperative to arrive on time for appointment to keep from having the test rescheduled. If you need to cancel or reschedule your appointment, please call the office within 24 hours of your appointment. Failure to do so may result in a cancellation of your appointment, and a $50 no show fee. Phone number given for call back for any questions. Daneil DolinSharon S Brooks '

## 2017-02-08 ENCOUNTER — Encounter (HOSPITAL_COMMUNITY): Payer: Self-pay | Admitting: Radiology

## 2017-02-08 ENCOUNTER — Telehealth: Payer: Self-pay

## 2017-02-08 ENCOUNTER — Ambulatory Visit (HOSPITAL_COMMUNITY): Payer: 59

## 2017-02-08 MED ORDER — METOPROLOL SUCCINATE ER 25 MG PO TB24: 25.0000 mg | ORAL_TABLET | Freq: Every day | ORAL | 3 refills | Status: DC

## 2017-02-08 NOTE — Progress Notes (Signed)
Patient was scheduled for Stress echo but will be rescheduled for two weeks due to severe hypertension. DOD (Dr Eden EmmsNishan) was notified and started patient on 25 mg of metoprolol.

## 2017-02-16 ENCOUNTER — Telehealth (HOSPITAL_COMMUNITY): Payer: Self-pay | Admitting: *Deleted

## 2017-02-16 NOTE — Telephone Encounter (Signed)
Left message on voicemail per DPR in reference to upcoming appointment scheduled on 02/20/17 at 7:30 with detailed instructions given per Stress Test Requisition Sheet for the test. LM to arrive 30 minutes early, and that it is imperative to arrive on time for appointment to keep from having the test rescheduled. If you need to cancel or reschedule your appointment, please call the office within 24 hours of your appointment. Failure to do so may result in a cancellation of your appointment, and a $50 no show fee. Phone number given for call back for any questions. Daneil DolinSharon S Brooks

## 2017-02-20 ENCOUNTER — Encounter (INDEPENDENT_AMBULATORY_CARE_PROVIDER_SITE_OTHER): Payer: Self-pay

## 2017-02-20 ENCOUNTER — Ambulatory Visit (HOSPITAL_BASED_OUTPATIENT_CLINIC_OR_DEPARTMENT_OTHER): Payer: 59

## 2017-02-20 ENCOUNTER — Ambulatory Visit (HOSPITAL_COMMUNITY): Payer: 59 | Attending: Cardiovascular Disease

## 2017-02-20 DIAGNOSIS — F17210 Nicotine dependence, cigarettes, uncomplicated: Secondary | ICD-10-CM

## 2017-02-20 DIAGNOSIS — R9431 Abnormal electrocardiogram [ECG] [EKG]: Secondary | ICD-10-CM | POA: Diagnosis not present

## 2017-02-20 DIAGNOSIS — I493 Ventricular premature depolarization: Secondary | ICD-10-CM

## 2017-02-20 HISTORY — PX: OTHER SURGICAL HISTORY: SHX169

## 2017-02-21 ENCOUNTER — Ambulatory Visit: Payer: 59 | Admitting: Cardiology

## 2017-02-23 ENCOUNTER — Encounter: Payer: Self-pay | Admitting: Cardiology

## 2017-02-23 ENCOUNTER — Ambulatory Visit: Payer: 59 | Admitting: Cardiology

## 2017-02-23 VITALS — BP 122/70 | HR 71 | Ht 67.0 in | Wt 176.0 lb

## 2017-02-23 DIAGNOSIS — I493 Ventricular premature depolarization: Secondary | ICD-10-CM | POA: Diagnosis not present

## 2017-02-23 NOTE — Progress Notes (Signed)
Cardiology Office Note:    Date:  02/23/2017   ID:  Katherine Brock, DOB 11/23/1965, MRN [id number]  PCP:  Merri BrunettePharr, Walter, MD  Cardiologist:  Garwin Brothersajan R Shanon Becvar, MD   Referring MD: Merri BrunettePharr, Walter, MD    ASSESSMENT:    1. Frequent unifocal PVCs    PLAN:    In order of problems listed above:  1. Stress test reports reviewed and discussion was done with the patient.  Her stress test was excellent reference tolerance is very good.  Stress test was normal.  In view of this I recommended her that she continue her exercise on a very regular basis.  She mentions to me that he was initially started beta-blocker she has had significant fatigue and it affects her quality of life and therefore I have asked her to stop this.  She will keep a track of her blood pressures twice a day and will be submitted to us in a month.  Primary prevention stressed.  Lipids are followed by her primary care physician.Patient will be seen in follow-up appointment in 6 months or earlier if the patient has any concerns    Medication Adjustments/Labs and Tests Ordered: Current medicines are reviewed at length with the patient today.  Concerns regarding medicines are outlined above.  No orders of the defined types were placed in this encounter.  No orders of the defined types were placed in this encounter.    Chief Complaint  Patient presents with  . Follow-up  . Chest Pain    Intermittently with activity  . Leg Pain    heaviness of bilateral legs  . Fatigue     History of Present Illness:    Katherine Brock is a 51 y.o. female.  She was evaluated for frequent PVCs.  She did well on the stress test.  She denies any problems at this time except the fact that the beta-blocker has given her a significant amount of fatigue.  No chest pain orthopnea or PND.  At the time of my evaluation she is alert awake oriented and in no distress.  She is very happy that her stress test has come out unremarkable.  She has quit  smoking now and has been using vape since September.  Past Medical History:  Diagnosis Date  . Anxiety   . Chronic back pain    hx of childhood scoliosis, has stabilization rods in back  . Depression   . Irregular heart beats   . Kidney stones   . Tobacco dependence     Past Surgical History:  Procedure Laterality Date  . CESAREAN SECTION  2001  . CHOLECYSTECTOMY  2009  . OOPHORECTOMY    . scoliosis stabilization  1979  . SPINAL FUSION    . TOTAL ABDOMINAL HYSTERECTOMY W/ BILATERAL SALPINGOOPHORECTOMY  2007    Current Medications: Current Meds  Medication Sig  . ALPRAZolam (XANAX) 0.25 MG tablet Take 0.25 mg by mouth every 8 (eight) hours as needed for anxiety.   Marland Kitchen. azithromycin (ZITHROMAX) 250 MG tablet   . buPROPion (WELLBUTRIN XL) 150 MG 24 hr tablet Take 150 mg by mouth every morning.  . diclofenac (VOLTAREN) 75 MG EC tablet TAKE 1 ENTERIC COATED TABLET 2 TIMES A DAY FOR 30 DAYS  . Multiple Vitamin (MULTI-VITAMINS) TABS Take by mouth.  . Multiple Vitamins-Calcium (ONE-A-DAY WOMENS PO) Take 1 tablet by mouth daily.  . sertraline (ZOLOFT) 50 MG tablet Take 75 mg by mouth daily.  Marland Kitchen. venlafaxine XR (EFFEXOR-XR) 150 MG  24 hr capsule Take 150 mg by mouth daily with breakfast.  . vitamin C (ASCORBIC ACID) 500 MG tablet Take 500 mg by mouth daily.  . [DISCONTINUED] metoprolol succinate (TOPROL XL) 25 MG 24 hr tablet Take 1 tablet (25 mg total) by mouth daily.     Allergies:   Patient has no known allergies.   Social History   Socioeconomic History  . Marital status: Married    Spouse name: None  . Number of children: None  . Years of education: None  . Highest education level: None  Social Needs  . Financial resource strain: None  . Food insecurity - worry: None  . Food insecurity - inability: None  . Transportation needs - medical: None  . Transportation needs - non-medical: None  Occupational History  . None  Tobacco Use  . Smoking status: Former Smoker     Packs/day: 0.25    Years: 20.00    Pack years: 5.00    Types: Cigarettes    Last attempt to quit: 11/24/2016    Years since quitting: 0.2  . Smokeless tobacco: Never Used  Substance and Sexual Activity  . Alcohol use: Yes  . Drug use: No  . Sexual activity: None  Other Topics Concern  . None  Social History Narrative  . None     Family History: The patient's family history includes Hypertension in her brother, father, and mother; Polymyalgia rheumatica in her father; Thyroid disease in her mother.  ROS:   Please see the history of present illness.    All other systems reviewed and are negative.  EKGs/Labs/Other Studies Reviewed:    The following studies were reviewed today: I reviewed the findings of the stress test extensively with the patient and questions were answered to her satisfaction.   Recent Labs: No results found for requested labs within last 8760 hours.  Recent Lipid Panel No results found for: CHOL, TRIG, HDL, CHOLHDL, VLDL, LDLCALC, LDLDIRECT  Physical Exam:    VS:  BP 122/70 (BP Location: Left Arm, Patient Position: Sitting)   Pulse 71   Ht 5\' 7"  (1.702 m)   Wt 176 lb (79.8 kg)   SpO2 99%   BMI 27.57 kg/m     Wt Readings from Last 3 Encounters:  02/23/17 176 lb (79.8 kg)  12/01/16 166 lb 1.9 oz (75.4 kg)  06/26/13 150 lb (68 kg)     GEN: Patient is in no acute distress HEENT: Normal NECK: No JVD; No carotid bruits LYMPHATICS: No lymphadenopathy CARDIAC: Hear sounds regular, 2/6 systolic murmur at the apex. RESPIRATORY:  Clear to auscultation without rales, wheezing or rhonchi  ABDOMEN: Soft, non-tender, non-distended MUSCULOSKELETAL:  No edema; No deformity  SKIN: Warm and dry NEUROLOGIC:  Alert and oriented x 3 PSYCHIATRIC:  Normal affect   Signed, Garwin Brothersajan R Dannon Perlow, MD  02/23/2017 8:52 AM    [facility]

## 2017-02-23 NOTE — Patient Instructions (Signed)
Medication Instructions:  Your physician has recommended you make the following change in your medication:  STOP metoprolol  Labwork: None  Testing/Procedures: None  Follow-Up: Your physician recommends that you schedule a follow-up appointment in: 6 months  Any Other Special Instructions Will Be Listed Below (If Applicable).     If you need a refill on your cardiac medications before your next appointment, please call your pharmacy.   [facility]  Booker Bhatnagar A, RN, BSN  

## 2017-03-02 ENCOUNTER — Ambulatory Visit: Payer: 59 | Admitting: Cardiology

## 2017-03-21 NOTE — Telephone Encounter (Signed)
Patient was scheduled for Stress echo but will be rescheduled for two weeks due to severe hypertension. DOD (Dr Nishan) was notified and started patient on 25 mg of metoprolol. 

## 2017-06-12 ENCOUNTER — Other Ambulatory Visit: Payer: Self-pay | Admitting: Internal Medicine

## 2017-06-12 DIAGNOSIS — Z1231 Encounter for screening mammogram for malignant neoplasm of breast: Secondary | ICD-10-CM

## 2017-07-24 ENCOUNTER — Ambulatory Visit
Admission: RE | Admit: 2017-07-24 | Discharge: 2017-07-24 | Disposition: A | Discharge status: Home / Self Care | Payer: 59 | Source: Ambulatory Visit | Attending: Internal Medicine | Admitting: Internal Medicine

## 2017-07-24 DIAGNOSIS — Z1231 Encounter for screening mammogram for malignant neoplasm of breast: Secondary | ICD-10-CM

## 2017-12-29 ENCOUNTER — Telehealth: Payer: Self-pay | Admitting: Cardiology

## 2017-12-29 NOTE — Telephone Encounter (Signed)
New Message:      Pt would like to switch from Revankar to Alicia. Please send me the okay's to go forward and I will schedule this appt.

## 2017-12-30 NOTE — Telephone Encounter (Signed)
I guess so.  Katherine Lemma, MD

## 2018-06-25 ENCOUNTER — Other Ambulatory Visit: Payer: Self-pay | Admitting: Internal Medicine

## 2018-06-25 DIAGNOSIS — Z1231 Encounter for screening mammogram for malignant neoplasm of breast: Secondary | ICD-10-CM

## 2018-08-22 ENCOUNTER — Ambulatory Visit
Admission: RE | Admit: 2018-08-22 | Discharge: 2018-08-22 | Disposition: A | Discharge status: Home / Self Care | Payer: 59 | Source: Ambulatory Visit | Attending: Internal Medicine | Admitting: Internal Medicine

## 2018-08-22 ENCOUNTER — Other Ambulatory Visit: Payer: Self-pay

## 2018-08-22 DIAGNOSIS — Z1231 Encounter for screening mammogram for malignant neoplasm of breast: Secondary | ICD-10-CM

## 2019-01-10 ENCOUNTER — Telehealth: Payer: Self-pay | Admitting: *Deleted

## 2019-01-10 NOTE — Telephone Encounter (Signed)
Follow Up:    Beverlee Nims returning your call.

## 2019-01-10 NOTE — Telephone Encounter (Signed)
I s/w Katherine Brock at [facility] and confirmed the fax was not for cardiac clearance , just need notes fax over  (ekg, OV note, stress test, echo). I will hand this off to HIM Dept so the records can be sent. I thanked Katherine Brock for her help.

## 2019-01-10 NOTE — Telephone Encounter (Signed)
Received what I thought was surgery clearance request, though looks like asking for records for pre op, there is no information on form in regards to surgery. I have left a message for [facility] [phone] for a call back to please clarify request.

## 2019-05-30 ENCOUNTER — Ambulatory Visit: Payer: 59

## 2019-07-15 ENCOUNTER — Other Ambulatory Visit: Payer: Self-pay | Admitting: Internal Medicine

## 2019-07-15 DIAGNOSIS — Z1231 Encounter for screening mammogram for malignant neoplasm of breast: Secondary | ICD-10-CM

## 2019-08-23 ENCOUNTER — Other Ambulatory Visit: Payer: Self-pay

## 2019-08-23 ENCOUNTER — Ambulatory Visit
Admission: RE | Admit: 2019-08-23 | Discharge: 2019-08-23 | Disposition: A | Discharge status: Home / Self Care | Payer: 59 | Source: Ambulatory Visit | Attending: Internal Medicine | Admitting: Internal Medicine

## 2019-08-23 DIAGNOSIS — Z1231 Encounter for screening mammogram for malignant neoplasm of breast: Secondary | ICD-10-CM

## 2019-12-20 ENCOUNTER — Other Ambulatory Visit: Payer: Self-pay | Admitting: Internal Medicine

## 2019-12-20 DIAGNOSIS — Z87891 Personal history of nicotine dependence: Secondary | ICD-10-CM

## 2020-01-06 ENCOUNTER — Ambulatory Visit
Admission: RE | Admit: 2020-01-06 | Discharge: 2020-01-06 | Disposition: A | Discharge status: Home / Self Care | Payer: 59 | Source: Ambulatory Visit | Attending: Internal Medicine | Admitting: Internal Medicine

## 2020-01-06 DIAGNOSIS — Z87891 Personal history of nicotine dependence: Secondary | ICD-10-CM

## 2020-01-10 ENCOUNTER — Telehealth: Payer: Self-pay | Admitting: Cardiology

## 2020-01-10 NOTE — Telephone Encounter (Signed)
New Message   Pt would like to switch provider from Dr. Tomie China to Dr. Herbie Baltimore. She said she saw Dr. Herbie Baltimore 8 years ago and would like to continue getting care from him

## 2020-01-10 NOTE — Telephone Encounter (Signed)
Ok with me 

## 2020-01-12 NOTE — Telephone Encounter (Signed)
OK - she will be new to me - but cannot be considered a truly new patient.Marland Kitchen  dh

## 2020-03-07 DIAGNOSIS — R931 Abnormal findings on diagnostic imaging of heart and coronary circulation: Secondary | ICD-10-CM

## 2020-03-07 HISTORY — DX: Abnormal findings on diagnostic imaging of heart and coronary circulation: R93.1

## 2020-03-18 ENCOUNTER — Encounter: Payer: Self-pay | Admitting: *Deleted

## 2020-03-18 ENCOUNTER — Other Ambulatory Visit: Payer: Self-pay

## 2020-03-18 ENCOUNTER — Ambulatory Visit (INDEPENDENT_AMBULATORY_CARE_PROVIDER_SITE_OTHER): Payer: 59

## 2020-03-18 ENCOUNTER — Ambulatory Visit: Payer: 59 | Admitting: Cardiology

## 2020-03-18 ENCOUNTER — Encounter: Payer: Self-pay | Admitting: Cardiology

## 2020-03-18 VITALS — BP 150/90 | HR 76 | Ht 67.0 in | Wt 171.2 lb

## 2020-03-18 DIAGNOSIS — R030 Elevated blood-pressure reading, without diagnosis of hypertension: Secondary | ICD-10-CM | POA: Diagnosis not present

## 2020-03-18 DIAGNOSIS — I2510 Atherosclerotic heart disease of native coronary artery without angina pectoris: Secondary | ICD-10-CM | POA: Diagnosis not present

## 2020-03-18 DIAGNOSIS — I493 Ventricular premature depolarization: Secondary | ICD-10-CM

## 2020-03-18 DIAGNOSIS — E785 Hyperlipidemia, unspecified: Secondary | ICD-10-CM | POA: Insufficient documentation

## 2020-03-18 NOTE — Patient Instructions (Signed)
Medication Instructions:  No changes *If you need a refill on your cardiac medications before your next appointment, please call your pharmacy*   Lab Work: Not needed   Testing/Procedures:  will be schedule at [address]  This is $99 out of pocket.   Coronary CalciumScan A coronary calcium scan is an imaging test used to look for deposits of calcium and other fatty materials (plaques) in the inner lining of the blood vessels of the heart (coronary arteries). These deposits of calcium and plaques can partly clog and narrow the coronary arteries without producing any symptoms or warning signs. This puts a person at risk for a heart attack. This test can detect these deposits before symptoms develop. Tell a health care provider about:  Any allergies you have.  All medicines you are taking, including vitamins, herbs, eye drops, creams, and over-the-counter medicines.  Any problems you or family members have had with anesthetic medicines.  Any blood disorders you have.  Any surgeries you have had.  Any medical conditions you have.  Whether you are pregnant or may be pregnant. What are the risks? Generally, this is a safe procedure. However, problems may occur, including:  Harm to a pregnant woman and her unborn baby. This test involves the use of radiation. Radiation exposure can be dangerous to a pregnant woman and her unborn baby. If you are pregnant, you generally should not have this procedure done.  Slight increase in the risk of cancer. This is because of the radiation involved in the test. What happens before the procedure? No preparation is needed for this procedure. What happens during the procedure?  You will undress and remove any jewelry around your neck or chest.  You will put on a hospital gown.  Sticky electrodes will be placed on your chest. The electrodes will be connected to an electrocardiogram (ECG) machine to record a tracing of the  electrical activity of your heart.  A CT scanner will take pictures of your heart. During this time, you will be asked to lie still and hold your breath for 2-3 seconds while a picture of your heart is being taken. The procedure may vary among health care providers and hospitals. What happens after the procedure?  You can get dressed.  You can return to your normal activities.  It is up to you to get the results of your test. Ask your health care provider, or the department that is doing the test, when your results will be ready. Summary  A coronary calcium scan is an imaging test used to look for deposits of calcium and other fatty materials (plaques) in the inner lining of the blood vessels of the heart (coronary arteries).  Generally, this is a safe procedure. Tell your health care provider if you are pregnant or may be pregnant.  No preparation is needed for this procedure.  A CT scanner will take pictures of your heart.  You can return to your normal activities after the scan is done. This information is not intended to replace advice given to you by your health care provider. Make sure you discuss any questions you have with your health care provider. Document Released: 08/20/2007 Document Revised: 01/11/2016 Document Reviewed: 01/11/2016 Elsevier Interactive Patient Education  2017 [employer].   Will be mailed to your home Your physician has recommended that you wear an 7 day Zio  monitor. Zio monitors are medical devices that record the heart's electrical activity. Doctors most often [place] these  monitors to diagnose arrhythmias. Arrhythmias are problems with the speed or rhythm of the heartbeat. The monitor is a small, portable device. You can wear one while you do your normal daily activities. This is usually used to diagnose what is causing palpitations/syncope (passing out).    Follow-Up: At [facility], you and your health needs are our priority.  As part of our  continuing mission to provide you with exceptional heart care, we have created designated Provider Care Teams.  These Care Teams include your primary Cardiologist (physician) and Advanced Practice Providers (APPs -  Physician Assistants and Nurse Practitioners) who all work together to provide you with the care you need, when you need it.  We recommend signing up for the patient portal called "MyChart".  Sign up information is provided on this After Visit Summary.  MyChart is used to connect with patients for Virtual Visits (Telemedicine).  Patients are able to view lab/test results, encounter notes, upcoming appointments, etc.  Non-urgent messages can be sent to your provider as well.   To learn more about what you can do with MyChart, go to [email or web address].    Your next appointment:   2 month(s)  The format for your next appointment:   In Person  Provider:   Bryan Lemma, MD   Other Instructions n/a    Christena Deem- Long Term Monitor Instructions   Your physician has requested you wear your ZIO patch monitor___7____days.   This is a single patch monitor.  [employer] supplies one patch monitor per enrollment.  Additional stickers are not available.   Please do not apply patch if you will be having a Nuclear Stress Test, Echocardiogram, Cardiac CT, MRI, or Chest Xray during the time frame you would be wearing the monitor. The patch cannot be worn during these tests.  You cannot remove and re-apply the ZIO XT patch monitor.   Your ZIO patch monitor will be sent USPS Priority mail from [facility] directly to your home address. The monitor may also be mailed to a PO BOX if home delivery is not available.   It may take 3-5 days to receive your monitor after you have been enrolled.   Once you have received you monitor, please review enclosed instructions.  Your monitor has already been registered assigning a specific monitor serial # to you.   Applying the monitor   Shave hair  from upper left chest.   Hold abrader disc by orange tab.  Rub abrader in 40 strokes over left upper chest as indicated in your monitor instructions.   Clean area with 4 enclosed alcohol pads .  Use all pads to assure are is cleaned thoroughly.  Let dry.   Apply patch as indicated in monitor instructions.  Patch will be place under collarbone on left side of chest with arrow pointing upward.   Rub patch adhesive wings for 2 minutes.Remove white label marked "1".  Remove white label marked "2".  Rub patch adhesive wings for 2 additional minutes.   While looking in a mirror, press and release button in center of patch.  A small green light will flash 3-4 times .  This will be your only indicator the monitor has been turned on.     Do not shower for the first 24 hours.  You may shower after the first 24 hours.   Press button if you feel a symptom. You will hear a small click.  Record Date, Time and Symptom in the Patient Log Book.  When you are ready to remove patch, follow instructions on last 2 pages of Patient Log Book.  Stick patch monitor onto last page of Patient Log Book.   Place Patient Log Book in [place] box.  Use locking tab on box and tape box closed securely.  The [employer] has [employer] on it.  Please place in mailbox as soon as possible.  Your physician should have your test results approximately 7 days after the monitor has been mailed back to [facility]   Call [facility] Customer Care at [phone] if you have questions regarding your ZIO XT patch monitor.  Call them immediately if you see an orange light blinking on your monitor.   If your monitor falls off in less than 4 days contact our Monitor department at [phone].  If your monitor becomes loose or falls off after 4 days call [employer] at [phone] for suggestions on securing your monitor.

## 2020-03-18 NOTE — Progress Notes (Signed)
Primary Care Provider: Merri Brunette, MD Cardiologist: No primary care provider on file. Electrophysiologist: None  Clinic Note: Chief Complaint  Patient presents with  . Follow-up    Coronary calcification seen on CT scan  . Palpitations    Frequent PVCs previously noted and evaluated.  Not overly symptomatic.   =================  ASSESSMENT/PLAN   Problem List Items Addressed This Visit    Frequent unifocal PVCs (Chronic)    Previously evaluated with Treadmill Stress Echocardiogram that was nonischemic in nature.  She is not having any anginal symptoms.  Based on echocardiographic images did not suggest any gross abnormalities such as valvular lesions, and baseline EF is normal with appropriate improvement.  She was quite symptomatic with beta-blocker treatment in the past, therefore I would simply just monitor and treat only if indicated based on symptoms..  I would like to see the PVC burden as this may change our treatment.  Plan: Zio patch monitor 7 days      Relevant Medications   atorvastatin (LIPITOR) 20 MG tablet   Other Relevant Orders   EKG 12-Lead (Completed)   LONG TERM MONITOR (3-14 DAYS)   CT CARDIAC SCORING (SELF PAY ONLY) (Completed)   Coronary artery calcification seen on CAT scan - Primary (Chronic)    Asymptomatic with negative treadmill stress echo just 3 years ago.  Plan: We will check coronary calcium score as baseline evaluation for restratification.      Relevant Medications   atorvastatin (LIPITOR) 20 MG tablet   Other Relevant Orders   EKG 12-Lead (Completed)   LONG TERM MONITOR (3-14 DAYS)   CT CARDIAC SCORING (SELF PAY ONLY) (Completed)   Hyperlipidemia with target LDL less than 100 (Chronic)    With coronary calcification seen on CT scan, she clearly has evidence of coronary disease, although not necessarily significant based on symptoms.  Plan: We are checking a coronary calcium score based on that we will determine goal for  LDL. Current LDL is 74 on moderate dose atorvastatin that she is tolerating well.  For now continue current dose.      Relevant Medications   atorvastatin (LIPITOR) 20 MG tablet   Elevated blood-pressure reading without diagnosis of hypertension    Her blood pressure is quite high today, she says that this is not necessarily normal, although she does have some headaches off and on.  We will reevaluate blood pressure next visit. Hold off adding any new medications for now pending results of upcoming studies.         HPI:    Katherine Brock is a 55 y.o. female former smoker (who now uses electronic cigarettes - JUUL) with a PMH notable for frequent PVCs And Anxiety/Depression who is being seen today for reevaluation of PVCs and Coronary Calcification seen on CT scan at the request of Merri Brunette, MD.  I apparently saw her back in 2013, and she was discharged to be seen on as-needed basis.  Katherine Brock was last seen on February 23, 2017 by Dr. Tomie China in follow-up after TM-Stress Echocardiogram ordered to evaluate frequent PVCs (first visit was September 2018).  Excellent exercise tolerance.  No major problems except the fact that she felt fatigued with beta-blocker in the past.  Recent Hospitalizations: None  Reviewed  CV studies:    The following studies were reviewed today: (if available, images/films reviewed: From Epic Chart or Care Everywhere) . TM Stress Echo 02/20/2017: Exercised 9:30 min - 10.9 METS.  83% MPHR (139 BPM) No CP.  Frequent PVCs noted both at rest and stress with occasional ventricular bigeminy.  No VT.  EF baseline 55% improved to 70% with no regional wall motion normalities.-Nonischemic.  Excellent exercise tolerance. . Chest CT-Lung Cancer Screening (01/06/2020): Lung-RADS 2.  Mild centrilobular eczema pulmonary nodules maximum volume derived equivalent diameter 2.4 mm (LUL).  Aortic atherosclerosis, age advanced coronary atherosclerosis-right coronary artery.    Interval History:   Katherine Brock is referred here today because of coronary calcification in the RCA noted on CT scan as an incidental finding.  She herself is totally asymptomatic, she walks about 2 times a day at a decent pace.  She denies any chest pain or pressure with rest or exertion.  No shortness of breath unless she is doing vigorous exercise.  Although she has frequent PVCs, she really denies any significant palpitations.   She has had several back operations (thoracic spine) dating back to childhood due to Scoliosis.  Back in November 2020, she had straightening of her lumbar spine.  Since this has been done, she is not longer having any of the significant back pain with radiculopathy.  She has not been walking as much however because she is now walking home, and has been somewhat "lazy ".  Her blood pressure is little high today, she has noted some off and on headaches, but no lightheadedness or dizziness.  No syncope or near syncope.  CV Review of Symptoms (Summary) Cardiovascular ROS: positive for - Exertional dyspnea because of deconditioning, and former smoker.  Although she has PVCs, does not notice frequent premature beats. negative for - chest pain, edema, orthopnea, palpitations, paroxysmal nocturnal dyspnea, rapid heart rate, shortness of breath or Lightheadedness or dizziness, syncope/near syncope or TIA /amaurosis fugax symptoms; claudication  The patient does not have symptoms concerning for COVID-19 infection (fever, chills, cough, or new shortness of breath).   REVIEWED OF SYSTEMS   Review of Systems  Constitutional: Negative for malaise/fatigue and weight loss.  HENT: Negative for congestion and nosebleeds.   Eyes: Negative for blurred vision and double vision.  Respiratory: Positive for cough, shortness of breath (With increased exertion) and wheezing.   Gastrointestinal: Negative for blood in stool and melena.  Genitourinary: Negative for hematuria.   Musculoskeletal: Positive for back pain (Doing much better since her surgery). Negative for falls.  Neurological: Positive for headaches (Off and on). Negative for dizziness.  Psychiatric/Behavioral: Negative for depression (Pretty well controlled) and memory loss. The patient is nervous/anxious. The patient does not have insomnia.      I have reviewed and (if needed) personally updated the patient's problem list, medications, allergies, past medical and surgical history, social and family history.   PAST MEDICAL HISTORY   Past Medical History:  Diagnosis Date  . Anxiety   . Chronic back pain    hx of childhood scoliosis, has stabilization rods in back  . Depression   . Frequent PVCs 2018   Evaluated with Stress Echo  . Kidney stones   . Tobacco dependence     PAST SURGICAL HISTORY   Past Surgical History:  Procedure Laterality Date  . CESAREAN SECTION  2001  . CHOLECYSTECTOMY  2009  . OOPHORECTOMY    . scoliosis stabilization  1979  . SPINAL FUSION    . TOTAL ABDOMINAL HYSTERECTOMY W/ BILATERAL SALPINGOOPHORECTOMY  2007    Immunization History  Administered Date(s) Administered  . Influenza Whole 12/05/2009    MEDICATIONS/ALLERGIES   Current Meds  Medication Sig  . ALPRAZolam (XANAX) 0.25 MG  tablet Take 0.25 mg by mouth every 8 (eight) hours as needed for anxiety.  Marland Kitchen atorvastatin (LIPITOR) 20 MG tablet Take 20 mg by mouth daily.  Marland Kitchen buPROPion (WELLBUTRIN XL) 150 MG 24 hr tablet Take 150 mg by mouth every morning.  . diphenhydrAMINE (SOMINEX) 25 MG tablet Take 25 mg by mouth as needed.  . Multiple Vitamins-Calcium (ONE-A-DAY WOMENS PO) Take 1 tablet by mouth daily.  Marland Kitchen senna (SENOKOT) 8.6 MG tablet Take 1 tablet by mouth as needed.  . sertraline (ZOLOFT) 100 MG tablet Take 100 mg by mouth daily.  . vitamin C (ASCORBIC ACID) 500 MG tablet Take 500 mg by mouth daily.    No Known Allergies  SOCIAL HISTORY/FAMILY HISTORY   Reviewed in Epic:  Pertinent findings:   Social History   Tobacco Use  . Smoking status: Former Smoker    Packs/day: 0.25    Years: 20.00    Pack years: 5.00    Types: Cigarettes    Quit date: 11/24/2016    Years since quitting: 3.3  . Smokeless tobacco: Never Used  . Tobacco comment: Currently using Juul cigarettes  Vaping Use  . Vaping Use: Every day  . Start date: 03/23/2016  . Devices: JUUL  Substance Use Topics  . Alcohol use: Yes  . Drug use: No   Social History   Social History Narrative   Walks 1-2 times per day most days of the week.      Currently working from home with the same company.      Now that she is divorced, she is a single mother.  (Her daughter is adopted at age 56).  Son is alive and well at 60 years old.    OBJCTIVE -PE, EKG, labs   Wt Readings from Last 3 Encounters:  03/18/20 171 lb 3.2 oz (77.7 kg)  02/23/17 176 lb (79.8 kg)  12/01/16 166 lb 1.9 oz (75.4 kg)    Physical Exam: BP (!) 150/90 (BP Location: Left Arm, Patient Position: Sitting)   Pulse 76   Ht 5\' 7"  (1.702 m)   Wt 171 lb 3.2 oz (77.7 kg)   SpO2 98%   BMI 26.81 kg/m  Physical Exam Vitals reviewed.  Constitutional:      General: She is not in acute distress.    Appearance: Normal appearance. She is normal weight. She is not ill-appearing or toxic-appearing.  HENT:     Head: Normocephalic and atraumatic.  Cardiovascular:     Rate and Rhythm: Normal rate. Rhythm irregular. Frequent extrasystoles are present.    Chest Wall: PMI is not displaced.     Pulses: Normal pulses.     Heart sounds: Murmur (Cannot exclude soft SEM) heard.  No friction rub. No gallop.   Pulmonary:     Effort: Pulmonary effort is normal. No respiratory distress.     Breath sounds: No wheezing.  Chest:     Chest wall: No tenderness.  Abdominal:     General: Abdomen is flat. Bowel sounds are normal. There is no distension.     Palpations: Abdomen is soft.     Comments: No HSM  Musculoskeletal:        General: No swelling. Normal range  of motion.  Skin:    General: Skin is warm and dry.  Neurological:     General: No focal deficit present.     Mental Status: She is alert and oriented to person, place, and time.     Cranial Nerves: No cranial nerve deficit.  Motor: No weakness.     Gait: Gait normal.  Psychiatric:        Mood and Affect: Mood normal.        Behavior: Behavior normal.        Thought Content: Thought content normal.        Judgment: Judgment normal.     Comments: Somewhat intense, but not overly anxious     Adult ECG Report  Rate: 76 ;  Rhythm: normal sinus rhythm, premature ventricular contractions (PVC) and Unifocal PVCs and trigeminy; otherwise normal axis, intervals and durations.  Narrative Interpretation: Frequent PVCs otherwise normal  Recent Labs: 12/13/2019  CBC: W 7.1, H/H 13.5/41.6, Plt 223; TSH 1.54  TC 170, TG 53, HDL 85, LDL 74   EKG from PCPs office: Sinus rhythm with occasional PVC.  Rate 62 bpm.  Cannot exclude atrial enlargement.  =======================  COVID-19 Education: The signs and symptoms of COVID-19 were discussed with the patient and how to seek care for testing (follow up with PCP or arrange E-visit).   The importance of social distancing and COVID-19 vaccination was discussed today. The patient is practicing social distancing & Masking.   I spent a total of 31 minutes with the patient spent in direct patient consultation.  Additional time spent with chart review  / charting (studies, outside notes, etc): 14 min Total Time: 45 min   Current medicines are reviewed at length with the patient today.  (+/- concerns) n/a  This visit occurred during the SARS-CoV-2 public health emergency.  Safety protocols were in place, including screening questions prior to the visit, additional usage of staff PPE, and extensive cleaning of exam room while observing appropriate contact time as indicated for disinfecting solutions.  Notice: This dictation was prepared with Dragon  dictation along with smaller phrase technology. Any transcriptional errors that result from this process are unintentional and may not be corrected upon review.  Patient Instructions / Medication Changes & Studies & Tests Ordered   Patient Instructions  Medication Instructions:  No changes *If you need a refill on your cardiac medications before your next appointment, please call your pharmacy*   Lab Work: Not needed   Testing/Procedures:  will be schedule at [address]  This is $99 out of pocket.  Coronary CalciumScan   Will be mailed to your home Your physician has recommended that you wear an 7 day Zio  monitor. Zio monitors are medical devices that record the heart's electrical activity. Doctors most often [place] these monitors to diagnose arrhythmias. Arrhythmias are problems with the speed or rhythm of the heartbeat. The monitor is a small, portable device. You can wear one while you do your normal daily activities. This is usually used to diagnose what is causing palpitations/syncope (passing out).    Follow-Up: At [facility], you and your health needs are our priority.  As part of our continuing mission to provide you with exceptional heart care, we have created designated Provider Care Teams.  These Care Teams include your primary Cardiologist (physician) and Advanced Practice Providers (APPs -  Physician Assistants and Nurse Practitioners) who all work together to provide you with the care you need, when you need it.  We recommend signing up for the patient portal called "MyChart".  Sign up information is provided on this After Visit Summary.  MyChart is used to connect with patients for Virtual Visits (Telemedicine).  Patients are able to view lab/test results, encounter notes, upcoming appointments, etc.  Non-urgent  messages can be sent to your provider as well.   To learn more about what you can do with MyChart, go to [email or web address].     Your next appointment:   2 month(s)  The format for your next appointment:   In Person  Provider:   Bryan Lemmaavid , MD   Other Instructions n/a    Christena DeemZIO XT- Long Term Monitor Instructions   Your physician has requested you wear your ZIO patch monitor___7____days.   This is a single patch monitor.  [employer] supplies one patch monitor per enrollment.  Additional stickers are not available.  Studies Ordered:   Orders Placed This Encounter  Procedures  . CT CARDIAC SCORING (SELF PAY ONLY)  . LONG TERM MONITOR (3-14 DAYS)  . EKG 12-Lead     Bryan Lemmaavid , M.D., M.S. Interventional Cardiologist   Pager # [phone] Phone # [phone] [address] [place], [place] [address]   Thank you for choosing Heartcare at [facility]!!

## 2020-03-18 NOTE — Progress Notes (Signed)
Patient ID: Katherine Brock, female   DOB: 1966-02-08, 55 y.o.   MRN: [id number] Patient enrolled for [employer] to ship a 14 day ZIO XT long term holter monitor to her home.

## 2020-03-19 ENCOUNTER — Ambulatory Visit (INDEPENDENT_AMBULATORY_CARE_PROVIDER_SITE_OTHER)
Admission: RE | Admit: 2020-03-19 | Discharge: 2020-03-19 | Disposition: A | Discharge status: Home / Self Care | Payer: Self-pay | Source: Ambulatory Visit | Attending: Cardiology | Admitting: Cardiology

## 2020-03-19 DIAGNOSIS — I493 Ventricular premature depolarization: Secondary | ICD-10-CM

## 2020-03-19 DIAGNOSIS — I2510 Atherosclerotic heart disease of native coronary artery without angina pectoris: Secondary | ICD-10-CM

## 2020-03-23 ENCOUNTER — Encounter: Payer: Self-pay | Admitting: Cardiology

## 2020-03-23 DIAGNOSIS — R030 Elevated blood-pressure reading, without diagnosis of hypertension: Secondary | ICD-10-CM | POA: Insufficient documentation

## 2020-03-23 NOTE — Assessment & Plan Note (Signed)
Previously evaluated with Treadmill Stress Echocardiogram that was nonischemic in nature.  She is not having any anginal symptoms.  Based on echocardiographic images did not suggest any gross abnormalities such as valvular lesions, and baseline EF is normal with appropriate improvement.  She was quite symptomatic with beta-blocker treatment in the past, therefore I would simply just monitor and treat only if indicated based on symptoms..  I would like to see the PVC burden as this may change our treatment.  Plan: Zio patch monitor 7 days

## 2020-03-23 NOTE — Assessment & Plan Note (Signed)
With coronary calcification seen on CT scan, she clearly has evidence of coronary disease, although not necessarily significant based on symptoms.  Plan: We are checking a coronary calcium score based on that we will determine goal for LDL. Current LDL is 74 on moderate dose atorvastatin that she is tolerating well.  For now continue current dose.

## 2020-03-23 NOTE — Assessment & Plan Note (Signed)
Her blood pressure is quite high today, she says that this is not necessarily normal, although she does have some headaches off and on.  We will reevaluate blood pressure next visit. Hold off adding any new medications for now pending results of upcoming studies.

## 2020-03-23 NOTE — Assessment & Plan Note (Signed)
Asymptomatic with negative treadmill stress echo just 3 years ago.  Plan: We will check coronary calcium score as baseline evaluation for restratification.

## 2020-05-05 ENCOUNTER — Ambulatory Visit: Payer: 59 | Admitting: Cardiology

## 2020-05-05 ENCOUNTER — Other Ambulatory Visit: Payer: Self-pay

## 2020-05-05 ENCOUNTER — Encounter: Payer: Self-pay | Admitting: Cardiology

## 2020-05-05 VITALS — BP 152/93 | HR 86 | Ht 67.0 in | Wt 172.2 lb

## 2020-05-05 DIAGNOSIS — E785 Hyperlipidemia, unspecified: Secondary | ICD-10-CM | POA: Diagnosis not present

## 2020-05-05 DIAGNOSIS — I493 Ventricular premature depolarization: Secondary | ICD-10-CM | POA: Diagnosis not present

## 2020-05-05 DIAGNOSIS — I2510 Atherosclerotic heart disease of native coronary artery without angina pectoris: Secondary | ICD-10-CM | POA: Diagnosis not present

## 2020-05-05 DIAGNOSIS — R030 Elevated blood-pressure reading, without diagnosis of hypertension: Secondary | ICD-10-CM | POA: Diagnosis not present

## 2020-05-05 MED ORDER — NADOLOL 80 MG PO TABS: 80.0000 mg | ORAL_TABLET | Freq: Every day | ORAL | 3 refills | Status: DC

## 2020-05-05 NOTE — Assessment & Plan Note (Addendum)
16.4% PVCs noted on Monitor.  Still concerned that this could be something structural.  She is asymptomatic, so I am reluctant to be aggressive treatment.  We need to determine what happens to PVCs with exertion.   Plan: GXT to determine PVC response to exercise, and 2D echo to reevaluate for possible abnormality. We will try low-dose long-acting beta-blocker, could potentially consider using verapamil if she does not tolerate nadolol.  Will discuss PVC findings with electrophysiology, determine if testing would benefit from further evaluation.

## 2020-05-05 NOTE — Patient Instructions (Signed)
Medication Instructions:  Start taking Nadolol  80 mg  Daily but for the first 2 weeks take 40 mg ( 1/2 tablet) daily   *If you need a refill on your cardiac medications before your next appointment, please call your pharmacy*   Lab Work: Not needed     Testing/Procedures: Will be schedule at [address] Your physician has requested that you have an echocardiogram. Echocardiography is a painless test that uses sound waves to create images of your heart. It provides your doctor with information about the size and shape of your heart and how well your heart's chambers and valves are working. This procedure takes approximately one hour. There are no restrictions for this procedure.  And  Will be schedule at [address] You will need a COVID-19  test  3 days prior to your procedure exercise tolerance test ( GXT) . You will be schedule a time. This is a Drive Up Visit at [address] [place], [place] [address]. Someone will direct you to the appropriate testing line. Stay in your car and someone will be with you shortly. Your physician has requested that you have an exercise tolerance test. Please also follow instruction sheet, as given.     Follow-Up: At [facility], you and your health needs are our priority.  As part of our continuing mission to provide you with exceptional heart care, we have created designated Provider Care Teams.  These Care Teams include your primary Cardiologist (physician) and Advanced Practice Providers (APPs -  Physician Assistants and Nurse Practitioners) who all work together to provide you with the care you need, when you need it.     Your next appointment:   1 to 2 month(s)  The format for your next appointment:   In Person  Provider:   Bryan Lemma, MD

## 2020-05-05 NOTE — Progress Notes (Signed)
Primary Care Provider: Merri Brunette, MD Cardiologist: No primary care provider on file. Electrophysiologist: None  Clinic Note: Chief Complaint  Patient presents with  . Follow-up    Test results  . PVCs none    Noted on monitor.   =================  ASSESSMENT/PLAN   Problem List Items Addressed This Visit    Elevated blood-pressure reading without diagnosis of hypertension (Chronic)    She had significant fatigue with beta-blockers in the past.  However, her pressures remain elevated, would probably consider long-acting dihydropyridine calcium channel blocker such as verapamil.        Relevant Orders   ECHOCARDIOGRAM COMPLETE   EXERCISE TOLERANCE TEST (ETT)   Frequent unifocal PVCs - Primary (Chronic)    16.4% PVCs noted on Monitor.  Still concerned that this could be something structural.  She is asymptomatic, so I am reluctant to be aggressive treatment.  We need to determine what happens to PVCs with exertion.   Plan: GXT to determine PVC response to exercise, and 2D echo to reevaluate for possible abnormality. We will try Brock-dose long-acting beta-blocker, could potentially consider using verapamil if she does not tolerate nadolol.  Will discuss PVC findings with electrophysiology, determine if testing would benefit from further evaluation.        Relevant Medications   nadolol (CORGARD) 80 MG tablet   Other Relevant Orders   ECHOCARDIOGRAM COMPLETE   EXERCISE TOLERANCE TEST (ETT)   Coronary artery calcification seen on CAT scan (Chronic)    Pretty Brock to go along with asymptomatic, negative treadmill stress echo 3 years ago.  This would argue against an ischemic etiology for PVCs.  Reasonable to continue with atorvastatin to reduce risk.  Probably does not need aspirin.      Relevant Medications   nadolol (CORGARD) 80 MG tablet   Other Relevant Orders   ECHOCARDIOGRAM COMPLETE   EXERCISE TOLERANCE TEST (ETT)   Hyperlipidemia with target LDL less than  100 (Chronic)    Her current LDL 74 on moderate dose atorvastatin.  Tolerating well.  For her risk, this is well within target range.      Relevant Medications   nadolol (CORGARD) 80 MG tablet      HPI:    Katherine Brock is a 55 y.o. female former smoker (who now uses electronic cigarettes - JUUL) with a PMH notable for frequent PVCs And Anxiety/Depression who is being seen today for reevaluation of PVCs and Coronary Calcification seen on CT scan - originally seen at the request of Katherine Brunette, MD.  I apparently saw her back in 2013, and she was discharged to be seen on as-needed basis. She was then seen on February 23, 2017 by Dr. Tomie Brock in follow-up after TM-Stress Echocardiogram ordered to evaluate frequent PVCs (first visit was September 2018).  Excellent exercise tolerance.  No major problems except the fact that she felt fatigued with beta-blocker in the past.  Katherine Brock was seen by me on March 18, 2018->  Basically she walks at least 2 times daily dissipates without any chest pain or pressure.  Really denies any palpitations despite having trigger PVCs.  She was not exercising as much as she should because of "feeling lazy . ->  I chose to evaluate her with a 7-day Zio patch to determine PVC burden along with a coronary calcium score for baseline cardiovascular risk based on calcification on CT scan of the chest..   Recent Hospitalizations: None  Reviewed  CV studies:    The  following studies were reviewed today: (if available, images/films reviewed: From Epic Chart or Care Everywhere)   03/20/2019 - Coronary calcium score of 18. This was 40 percentile for age and sex matched control.   Zio patch monitor:   Predominant underlying rhythm was Sinus RhythmPatient had a min HR of 56 bpm, max HR of 144 bpm, and avg HR of 81 bpm. . Isolated SVEs were rare (<1.0%, 396), SVE Couplets were rare (<1.0%, 8), and no SVETriplets were present.   Isolated VEs were frequent  (16.4%, [id number]), VE Couplets were rare (<1.0%, 1993), and VE Triplets were rare (<1.0%,152). Ventricular Bigeminy and Trigeminy were present.   Occasional change in QRS axis possibly due to positional changes.  Interval History:   Katherine Brock RETURNS today to discuss results of her studies.  Relatively reassuring coronary calcium score of 18 with indicating Brock likelihood of having ischemic CAD, however I am concerned the 16.4% PVCs on monitor.  She says he is doing well.  She always has high blood pressure and was comes in to the office, but not usually at home. She really does not feel the PVCs, probably because they are relatively isolated with minimal couplets, triplets, bigeminy or trigeminy.  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 She is basically asymptomatic from cardiac standpoint.  CV Review of Symptoms (Summary) Cardiovascular ROS: positive for - Some exertional dyspnea related to deconditioning, and being a former smoker.  She is not as active as he should be.  Also not rarely notes palpitations. negative for - chest pain, edema, orthopnea, paroxysmal nocturnal dyspnea, rapid heart rate, shortness of breath or Lightheadedness or dizziness, syncope/near syncope or TIA /amaurosis fugax symptoms; claudication  The patient DOES NOT have symptoms concerning for COVID-19 infection (fever, chills, cough, or new shortness of breath).   REVIEWED OF SYSTEMS   Review of Systems  Constitutional: Negative for malaise/fatigue and weight loss.  HENT: Negative for congestion and nosebleeds.   Eyes: Negative for blurred vision and double vision.  Respiratory: Positive for cough (Mild chronic), shortness of breath (With more than usual exertion) and wheezing.   Gastrointestinal: Negative for blood in stool and melena.  Genitourinary: Negative for hematuria.  Musculoskeletal: Positive for back pain (Doing much better since her surgery). Negative for falls.  Neurological:  Positive for headaches (Off and on). Negative for dizziness.  Psychiatric/Behavioral: Negative for depression (Pretty well controlled) and memory loss. The patient is nervous/anxious (Also relatively well controlled.;  Does have stress response.). The patient does not have insomnia.     I have reviewed and (if needed) personally updated the patient's problem list, medications, allergies, past medical and surgical history, social and family history.   PAST MEDICAL HISTORY   Past Medical History:  Diagnosis Date  . Agatston coronary artery calcium score less than 100 03/2020   Coronary calcium score 18  . Anxiety   . Chronic back pain    hx of childhood scoliosis, has stabilization rods in back  . Depression   . Frequent PVCs 2018   Evaluated with Stress Echo -> nonischemic; 16.4 %: January 2022  . Kidney stones   . Tobacco dependence     PAST SURGICAL HISTORY   Past Surgical History:  Procedure Laterality Date  . CESAREAN SECTION  2001  . CHOLECYSTECTOMY  2009  . OOPHORECTOMY    . scoliosis stabilization  1979  . SPINAL FUSION    . TOTAL ABDOMINAL HYSTERECTOMY W/ BILATERAL SALPINGOOPHORECTOMY  2007  . TREADMILL STRESS  ECHOCARDIOGRAM  02/20/2017   Exercised 9:30 min - 10.9 METS.  83% MPHR (139 BPM) No CP. Frequent PVCs noted both at rest and stress with occasional ventricular bigeminy.  No VT.  EF baseline 55% improved to 70% with no regional wall motion normalities.-Nonischemic.  Excellent exercise tolerance.     Immunization History  Administered Date(s) Administered  . Influenza Whole 12/05/2009    MEDICATIONS/ALLERGIES   Current Meds  Medication Sig  . ALPRAZolam (XANAX) 0.25 MG tablet Take 0.25 mg by mouth every 8 (eight) hours as needed for anxiety.  Marland Kitchen atorvastatin (LIPITOR) 20 MG tablet Take 20 mg by mouth daily.  Marland Kitchen buPROPion (WELLBUTRIN XL) 150 MG 24 hr tablet Take 150 mg by mouth every morning.  . cholecalciferol (VITAMIN D3) 25 MCG (1000 UNIT) tablet 1 tablet   . Multiple Vitamins-Calcium (ONE-A-DAY WOMENS PO) Take 1 tablet by mouth daily.  . nadolol (CORGARD) 80 MG tablet Take 1 tablet (80 mg total) by mouth daily.  Marland Kitchen senna (SENOKOT) 8.6 MG tablet Take 1 tablet by mouth as needed.  . sertraline (ZOLOFT) 100 MG tablet Take 100 mg by mouth daily.  . vitamin C (ASCORBIC ACID) 500 MG tablet Take 500 mg by mouth daily.    No Known Allergies  SOCIAL HISTORY/FAMILY HISTORY   Reviewed in Epic:  Pertinent findings:  Social History   Tobacco Use  . Smoking status: Former Smoker    Packs/day: 0.25    Years: 20.00    Pack years: 5.00    Types: Cigarettes    Quit date: 11/24/2016    Years since quitting: 3.4  . Smokeless tobacco: Never Used  . Tobacco comment: Currently using Juul cigarettes  Vaping Use  . Vaping Use: Every day  . Start date: 03/23/2016  . Devices: JUUL  Substance Use Topics  . Alcohol use: Yes  . Drug use: No   Social History   Social History Narrative   Walks 1-2 times per day most days of the week.      Currently working from home with the same company.      Now that she is divorced, she is a single mother.  (Her daughter is adopted at age 55).  Son is alive and well at 9 years old.    OBJCTIVE -PE, EKG, labs   Wt Readings from Last 3 Encounters:  05/05/20 172 lb 3.2 oz (78.1 kg)  03/18/20 171 lb 3.2 oz (77.7 kg)  02/23/17 176 lb (79.8 kg)    Physical Exam: BP (!) 152/93   Pulse 86   Ht 5\' 7"  (1.702 m)   Wt 172 lb 3.2 oz (78.1 kg)   SpO2 98%   BMI 26.97 kg/m  Physical Exam Vitals reviewed.  Constitutional:      General: She is not in acute distress.    Appearance: Normal appearance. She is normal weight. She is not ill-appearing or toxic-appearing.     Comments: Well-groomed.  HENT:     Head: Normocephalic and atraumatic.  Cardiovascular:     Rate and Rhythm: Normal rate. Rhythm irregular. Frequent extrasystoles are present.    Chest Wall: PMI is not displaced.     Pulses: Normal pulses.      Heart sounds: Murmur (Cannot exclude soft SEM) heard.  No friction rub. No gallop.   Pulmonary:     Effort: Pulmonary effort is normal. No respiratory distress.     Breath sounds: No wheezing.  Chest:     Chest wall: No tenderness.  Musculoskeletal:  General: No swelling. Normal range of motion.  Skin:    General: Skin is warm and dry.     Coloration: Skin is not pale.  Neurological:     General: No focal deficit present.     Mental Status: She is alert and oriented to person, place, and time.     Cranial Nerves: No cranial nerve deficit.     Motor: No weakness.     Gait: Gait normal.  Psychiatric:        Mood and Affect: Mood normal.        Behavior: Behavior normal.        Thought Content: Thought content normal.        Judgment: Judgment normal.     Comments: Somewhat intense, but not overly anxious     Adult ECG Report N/A  Recent Labs: 12/13/2019 no updates since  CBC: W 7.1, H/H 13.5/41.6, Plt 223; TSH 1.54  TC 170, TG 53, HDL 85, LDL 74  Continue =======================  COVID-19 Education: The signs and symptoms of COVID-19 were discussed with the patient and how to seek care for testing (follow up with PCP or arrange E-visit).   The importance of social distancing and COVID-19 vaccination was discussed today. The patient is practicing social distancing & Masking.   I spent a total of 40 minutes with the patient spent in direct patient consultation.  Additional time spent with chart review  / charting (studies, outside notes, etc): 16min Total Time: 56min   Current medicines are reviewed at length with the patient today.  (+/- concerns) n/a  This visit occurred during the SARS-CoV-2 public health emergency.  Safety protocols were in place, including screening questions prior to the visit, additional usage of staff PPE, and extensive cleaning of exam room while observing appropriate contact time as indicated for disinfecting solutions.  Notice: This  dictation was prepared with Dragon dictation along with smaller phrase technology. Any transcriptional errors that result from this process are unintentional and may not be corrected upon review.  Patient Instructions / Medication Changes & Studies & Tests Ordered   Patient Instructions  Medication Instructions:  Start taking Nadolol  80 mg  Daily but for the first 2 weeks take 40 mg ( 1/2 tablet) daily   *If you need a refill on your cardiac medications before your next appointment, please call your pharmacy*   Lab Work: Not needed     Testing/Procedures: Will be schedule at [address] Your physician has requested that you have an echocardiogram. Echocardiography is a painless test that uses sound waves to create images of your heart. It provides your doctor with information about the size and shape of your heart and how well your heart's chambers and valves are working. This procedure takes approximately one hour. There are no restrictions for this procedure.  And  Will be schedule at [address] You will need a COVID-19  test  3 days prior to your procedure exercise tolerance test ( GXT) . You will be schedule a time. This is a Drive Up Visit at [id number] [place], [place] [id number]. Someone will direct you to the appropriate testing line. Stay in your car and someone will be with you shortly. Your physician has requested that you have an exercise tolerance test. Please also follow instruction sheet, as given.     Follow-Up: At [facility], you and your health needs are our priority.  As part of our  continuing mission to provide you with exceptional heart care, we have created designated Provider Care Teams.  These Care Teams include your primary Cardiologist (physician) and Advanced Practice Providers (APPs -  Physician Assistants and Nurse Practitioners) who all work together to provide you with the care you need, when you need  it.     Your next appointment:   1 to 2 month(s)  The format for your next appointment:   In Person  Provider:   Bryan Lemma, MD       Studies Ordered:   Orders Placed This Encounter  Procedures  . EXERCISE TOLERANCE TEST (ETT)  . ECHOCARDIOGRAM COMPLETE     Bryan Lemma, M.D., M.S. Interventional Cardiologist   Pager # [phone] Phone # [phone] [address] [place], [place] [address]   Thank you for choosing Heartcare at [facility]!!

## 2020-05-06 ENCOUNTER — Telehealth: Payer: Self-pay | Admitting: Cardiology

## 2020-05-06 ENCOUNTER — Encounter: Payer: Self-pay | Admitting: Cardiology

## 2020-05-06 NOTE — Telephone Encounter (Signed)
Spoke with patient regarding scheduled COVID screening 05/13/20 at 3:10pm at [address]---ETT scheduled 05/15/20 at 9:45 am at [facility] location and follow up with Dr. Herbie Baltimore 06/08/20 at 11:40 am.  Will mail information to patient and she voiced her understanding.

## 2020-05-10 ENCOUNTER — Encounter: Payer: Self-pay | Admitting: Cardiology

## 2020-05-10 NOTE — Assessment & Plan Note (Signed)
Pretty low to go along with asymptomatic, negative treadmill stress echo 3 years ago.  This would argue against an ischemic etiology for PVCs.  Reasonable to continue with atorvastatin to reduce risk.  Probably does not need aspirin.

## 2020-05-10 NOTE — Assessment & Plan Note (Signed)
She had significant fatigue with beta-blockers in the past.  However, her pressures remain elevated, would probably consider long-acting dihydropyridine calcium channel blocker such as verapamil.

## 2020-05-10 NOTE — Assessment & Plan Note (Signed)
Her current LDL 74 on moderate dose atorvastatin.  Tolerating well.  For her risk, this is well within target range.

## 2020-05-13 ENCOUNTER — Ambulatory Visit: Payer: 59 | Admitting: Cardiology

## 2020-05-13 ENCOUNTER — Other Ambulatory Visit: Payer: Self-pay | Admitting: *Deleted

## 2020-05-13 ENCOUNTER — Other Ambulatory Visit (HOSPITAL_COMMUNITY)
Admission: RE | Admit: 2020-05-13 | Discharge: 2020-05-13 | Disposition: A | Discharge status: Home / Self Care | Payer: 59 | Source: Ambulatory Visit | Attending: Cardiology | Admitting: Cardiology

## 2020-05-13 DIAGNOSIS — I493 Ventricular premature depolarization: Secondary | ICD-10-CM

## 2020-05-13 DIAGNOSIS — Z01812 Encounter for preprocedural laboratory examination: Secondary | ICD-10-CM | POA: Insufficient documentation

## 2020-05-13 DIAGNOSIS — I2510 Atherosclerotic heart disease of native coronary artery without angina pectoris: Secondary | ICD-10-CM

## 2020-05-13 DIAGNOSIS — R9431 Abnormal electrocardiogram [ECG] [EKG]: Secondary | ICD-10-CM

## 2020-05-13 DIAGNOSIS — Z20822 Contact with and (suspected) exposure to covid-19: Secondary | ICD-10-CM | POA: Insufficient documentation

## 2020-05-13 LAB — SARS CORONAVIRUS 2 (TAT 6-24 HRS): SARS Coronavirus 2: NEGATIVE

## 2020-05-13 NOTE — Progress Notes (Signed)
Please signed attestation for exercise tolerance

## 2020-05-14 ENCOUNTER — Telehealth (HOSPITAL_COMMUNITY): Payer: Self-pay | Admitting: *Deleted

## 2020-05-14 NOTE — Telephone Encounter (Signed)
Close encounter 

## 2020-05-15 ENCOUNTER — Ambulatory Visit (HOSPITAL_COMMUNITY)
Admission: RE | Admit: 2020-05-15 | Discharge: 2020-05-15 | Disposition: A | Discharge status: Home / Self Care | Payer: 59 | Source: Ambulatory Visit | Attending: Cardiology | Admitting: Cardiology

## 2020-05-15 ENCOUNTER — Other Ambulatory Visit: Payer: Self-pay

## 2020-05-15 DIAGNOSIS — I2510 Atherosclerotic heart disease of native coronary artery without angina pectoris: Secondary | ICD-10-CM | POA: Insufficient documentation

## 2020-05-15 DIAGNOSIS — I493 Ventricular premature depolarization: Secondary | ICD-10-CM | POA: Diagnosis not present

## 2020-05-15 DIAGNOSIS — R072 Precordial pain: Secondary | ICD-10-CM

## 2020-05-15 DIAGNOSIS — R030 Elevated blood-pressure reading, without diagnosis of hypertension: Secondary | ICD-10-CM | POA: Diagnosis present

## 2020-05-15 HISTORY — PX: OTHER SURGICAL HISTORY: SHX169

## 2020-05-15 LAB — EXERCISE TOLERANCE TEST
Estimated workload: 10.1 METS
Exercise duration (min): 8 min
Exercise duration (sec): 35 s
MPHR: 165 {beats}/min
Peak HR: 164 {beats}/min
Percent HR: 99 %
Rest HR: 71 {beats}/min

## 2020-05-18 ENCOUNTER — Telehealth: Payer: Self-pay | Admitting: Cardiology

## 2020-05-18 MED ORDER — METOPROLOL TARTRATE 100 MG PO TABS: ORAL_TABLET | ORAL | 0 refills | Status: DC

## 2020-05-18 NOTE — Telephone Encounter (Signed)
Spoke with the patient and reviewed results from stress test. Patient states that she would prefer to have the coronary CTA done rather than a heart cath at this point. Orders for coronary CTA have been placed. Instructions have been reviewed with the patient and also sent through MyChart.

## 2020-05-18 NOTE — Telephone Encounter (Signed)
Left message to call back  

## 2020-05-18 NOTE — Telephone Encounter (Signed)
Pt called in stated she would like to talk to [facility].  She stated that she decided to do the other test instead of the CT.  She spoke to PCP and she feels better about doing the craterization.     Best number - [phone]

## 2020-05-19 NOTE — Telephone Encounter (Signed)
Spoke with the pt and she agrees to having a cath... per Dr. Elissa Hefty note... pt to see Edd Fabian NP 05/22/20 for her cath workup.

## 2020-05-21 NOTE — H&P (View-Only) (Signed)
Cardiology Clinic Note   Patient Name: Katherine Brock Date of Encounter: 05/22/2020  Primary Care Provider:  Merri BrunettePharr, Walter, MD Primary Cardiologist:  Bryan Lemmaavid Harding, MD  Patient Profile    Katherine Brock 55 year old female presents the clinic today for review of her stress test.  Past Medical History    Past Medical History:  Diagnosis Date  . Agatston coronary artery calcium score less than 100 03/2020   Coronary calcium score 18  . Anxiety   . Chronic back pain    hx of childhood scoliosis, has stabilization rods in back  . Depression   . Frequent PVCs 2018   Evaluated with Stress Echo -> nonischemic; 16.4 %: January 2022  . Kidney stones   . Tobacco dependence    Past Surgical History:  Procedure Laterality Date  . CESAREAN SECTION  2001  . CHOLECYSTECTOMY  2009  . OOPHORECTOMY    . scoliosis stabilization  1979  . SPINAL FUSION    . TOTAL ABDOMINAL HYSTERECTOMY W/ BILATERAL SALPINGOOPHORECTOMY  2007  . TREADMILL STRESS ECHOCARDIOGRAM  02/20/2017   Exercised 9:30 min - 10.9 METS.  83% MPHR (139 BPM) No CP. Frequent PVCs noted both at rest and stress with occasional ventricular bigeminy.  No VT.  EF baseline 55% improved to 70% with no regional wall motion normalities.-Nonischemic.  Excellent exercise tolerance.     Allergies  No Known Allergies  History of Present Illness    Katherine Brock has a PMH of elevated blood pressure, PVCs, coronary artery calcification-seen on CT scan, and hyperlipidemia.  Her PMH also includes anxiety/depression, and remote tobacco abuse.  She was initially seen by Dr. Herbie BaltimoreHarding in 2013.  During that time she was felt to be stable and only needs to be seen as needed.  She was seen by Dr. Tomie Chinaevankar 02/2007 after stress echo to evaluate for frequent PVCs.  During that time she was noted to have excellent exercise tolerance with no major problems.  She did note fatigue with beta-blocker therapy.  She was seen by Dr. Herbie BaltimoreHarding on 1/20.  During that  time she was walking 2 times daily and denied chest pain or pressure.  She denied palpitations but did note trigger PVCs.  She had a cardiac event monitor 04/10/2020 which showed primary sinus rhythm, isolated PACs, frequent PVCs 16.4%, and no A. fib/flutter, pauses, NSVT/VT.  A calcium scoring was also ordered 03/20/2019 which showed calcium score of 18 which placed her in the 85th percentile for age and sex matched control.  On follow-up she reported she was doing well.  She reported controlled blood pressure at home.  She did not feel her PVCs and was asymptomatic at that time.  It was felt that further evaluation was needed and exercise tolerance test was ordered.  She underwent stress testing on 05/15/2020.  It showed hypertensive response to exercise, PVCs, and ST depression in inferior lateral leads with borderline ST elevation in aVR and aVL.  Cardiac catheterization was recommended.  She presents the clinic today for follow-up evaluation states she continues to be very physically active walking 3-4 miles per day.  She does note some occasional periods of shortness of breath.  However she denies lower extremity swelling chest pain.  We reviewed her coronary calcium score and her stress testing.  We reviewed both the coronary CTA and cardiac catheterization.  At this time she wishes to proceed with cardiac catheterization.  Risks and benefits reviewed.  She expressed understanding.  Her biggest concern at this  time was discussing the cardiac catheterization with her elderly parents.  We will have her follow-up after cardiac catheterization.  Today she denies chest pain, shortness of breath, lower extremity edema, fatigue, palpitations, melena, hematuria, hemoptysis, diaphoresis, weakness, presyncope, syncope, orthopnea, and PND.   Home Medications    Prior to Admission medications   Medication Sig Start Date End Date Taking? Authorizing Provider  ALPRAZolam (XANAX) 0.25 MG tablet Take 0.25 mg by  mouth every 8 (eight) hours as needed for anxiety.    [provider]  atorvastatin (LIPITOR) 20 MG tablet Take 20 mg by mouth daily. 01/05/20   [provider]  buPROPion (WELLBUTRIN XL) 150 MG 24 hr tablet Take 150 mg by mouth every morning. 01/29/17   [provider]  cholecalciferol (VITAMIN D3) 25 MCG (1000 UNIT) tablet 1 tablet    [provider]  metoprolol tartrate (LOPRESSOR) 100 MG tablet Take 1 tablet (100 mg total) two hours prior to CT scan. 05/18/20   Marykay Lex, MD  Multiple Vitamins-Calcium (ONE-A-DAY WOMENS PO) Take 1 tablet by mouth daily.    [provider]  nadolol (CORGARD) 80 MG tablet Take 1 tablet (80 mg total) by mouth daily. 05/05/20   Marykay Lex, MD  senna (SENOKOT) 8.6 MG tablet Take 1 tablet by mouth as needed.    [provider]  sertraline (ZOLOFT) 100 MG tablet Take 100 mg by mouth daily. 01/04/20   [provider]  vitamin C (ASCORBIC ACID) 500 MG tablet Take 500 mg by mouth daily.    [provider]    Family History    Family History  Problem Relation Age of Onset  . Hypertension Mother   . Graves' disease Mother   . Hypertension Father   . Polymyalgia rheumatica Father   . Hypertension Brother   . Breast cancer Paternal Grandmother        unsure of age   She indicated that her mother is alive. She indicated that her father is alive. She indicated that her brother is alive. She indicated that the status of her paternal grandmother is unknown. She indicated that her daughter is alive. She indicated that her son is alive.  Social History    Social History   Socioeconomic History  . Marital status: Divorced    Spouse name: Not on file  . Number of children: 2  . Years of education: Not on file  . Highest education level: Not on file  Occupational History  . Occupation: Investment banker, corporate: [employer]  Tobacco Use  . Smoking status: Former Smoker    Packs/day:  0.25    Years: 20.00    Pack years: 5.00    Types: Cigarettes    Quit date: 11/24/2016    Years since quitting: 3.4  . Smokeless tobacco: Never Used  . Tobacco comment: Currently using Juul cigarettes  Vaping Use  . Vaping Use: Every day  . Start date: 03/23/2016  . Devices: JUUL  Substance and Sexual Activity  . Alcohol use: Yes  . Drug use: No  . Sexual activity: Not on file  Other Topics Concern  . Not on file  Social History Narrative   Walks 1-2 times per day most days of the week.      Currently working from home with the same company.      Now that she is divorced, she is a single mother.  (Her daughter is adopted at age 6).  Son  is alive and well at 76 years old.   Social Determinants of Health   Financial Resource Strain: Not on file  Food Insecurity: Not on file  Transportation Needs: Not on file  Physical Activity: Not on file  Stress: Not on file  Social Connections: Not on file  Intimate Partner Violence: Not on file     Review of Systems    General:  No chills, fever, night sweats or weight changes.  Cardiovascular:  No chest pain, dyspnea on exertion, edema, orthopnea, palpitations, paroxysmal nocturnal dyspnea. Dermatological: No rash, lesions/masses Respiratory: No cough, dyspnea Urologic: No hematuria, dysuria Abdominal:   No nausea, vomiting, diarrhea, bright red blood per rectum, melena, or hematemesis Neurologic:  No visual changes, wkns, changes in mental status. All other systems reviewed and are otherwise negative except as noted above.  Physical Exam    VS:  BP 102/80   Pulse (!) 51   Ht 5\' 7"  (1.702 m)   Wt 171 lb 9.6 oz (77.8 kg)   SpO2 99%   BMI 26.88 kg/m  , BMI Body mass index is 26.88 kg/m. GEN: Well nourished, well developed, in no acute distress. HEENT: normal. Neck: Supple, no JVD, carotid bruits, or masses. Cardiac: RRR, no murmurs, rubs, or gallops. No clubbing, cyanosis, edema.  Radials/DP/PT 2+ and equal bilaterally.   Respiratory:  Respirations regular and unlabored, clear to auscultation bilaterally. GI: Soft, nontender, nondistended, BS + x 4. MS: no deformity or atrophy. Skin: warm and dry, no rash. Neuro:  Strength and sensation are intact. Psych: Normal affect.  Accessory Clinical Findings    Recent Labs: No results found for requested labs within last 8760 hours.   Recent Lipid Panel No results found for: CHOL, TRIG, HDL, CHOLHDL, VLDL, LDLCALC, LDLDIRECT  ECG personally reviewed by me today-none today.  Cardiac event monitor 04/10/2020  Primarily Sinus Rhythm-heart rate ranged from 56 to 144 bpm with an average rate of 81 bpm.  Rare isolated PACs.  Frequent isolated PVCS (16.4%) with rare couplets, triplets and bigeminy/trigeminy.  No arrhythmias: Atrial fibrillation, atrial flutter, SVT, NSVT/VT or significant bradycardia/pauses    Patch Wear Time:  6 days and 20 hours (2022-01-20T19:18:51-0500 to 2022-01-27T16:15:20-0500) Still has frequent PVCs.   2020-04-04, MD   Exercise tolerance test 05/15/2020  Blood pressure demonstrated a hypertensive response to exercise.  Horizontal ST segment depression ST segment depression of 3 mm was noted during stress in the II, III, aVF, V5 and V6 leads.   Positive stress test, with horizontal and downsloping ST depressions in inferolateral leads. There is also borderline ST elevation in aVR and aVL. Strip reviewed by DOD Dr. 07/15/2020 after time of test. Findings communicated with Dr. Sw[place].   Assessment & Plan   1.  Abnormal stress test/coronary calcification -no chest pain today.  Coronary calcium score 18.  Underwent stress testing 05/15/2020 which was positive with horizontal and downsloping ST depressions in the inferior lateral leads.  Details above.  Cardiac catheterization recommended. Continue atorvastatin, nadolol Heart healthy low-sodium diet-salty 6 given Schedule cardiac catheterization  Shared Decision Making/Informed  Consent The risks [stroke (1 in 1000), death (1 in 1000), kidney failure [usually temporary] (1 in 500), bleeding (1 in 200), allergic reaction [possibly serious] (1 in 200)], benefits (diagnostic support and management of coronary artery disease) and alternatives of a cardiac catheterization were discussed in detail with Katherine Brock and she is willing to proceed.    Elevated blood pressure-BP today 102/80.  Well-controlled at home. Continue nadolol Heart  healthy low-sodium diet-salty 6 given  Frequent PVCs-remains cardiac unaware.  16.4% PVC burden noted on cardiac monitor. Continue nadolol Heart healthy low-sodium diet-salty 6 given Avoid triggers caffeine, chocolate, EtOH, dehydration etc.  Hyperlipidemia-LDL 74 on previous labs. Continue Atorvastatin Heart healthy low-sodium high-fiber diet   Disposition: Follow-up with Dr. Herbie Baltimore after cardiac catheterization.   Thomasene Ripple. Draxton Luu NP-C    05/22/2020, 12:38 PM [facility] [address] Office [phone] Fax [phone]  Notice: This dictation was prepared with Dragon dictation along with smaller phrase technology. Any transcriptional errors that result from this process are unintentional and may not be corrected upon review.  I spent 20 minutes examining this patient, reviewing medications, and using patient centered shared decision making involving her cardiac care.  Prior to her visit I spent greater than 20 minutes reviewing her past medical history,  medications, and prior cardiac tests.

## 2020-05-21 NOTE — Progress Notes (Signed)
 Cardiology Clinic Note   Patient Name: Katherine Brock Date of Encounter: 05/22/2020  Primary Care Provider:  Pharr, Walter, MD Primary Cardiologist:  David Harding, MD  Patient Profile    Katherine Brock 55-year-old female presents the clinic today for review of her stress test.  Past Medical History    Past Medical History:  Diagnosis Date  . Agatston coronary artery calcium score less than 100 03/2020   Coronary calcium score 18  . Anxiety   . Chronic back pain    hx of childhood scoliosis, has stabilization rods in back  . Depression   . Frequent PVCs 2018   Evaluated with Stress Echo -> nonischemic; 16.4 %: January 2022  . Kidney stones   . Tobacco dependence    Past Surgical History:  Procedure Laterality Date  . CESAREAN SECTION  2001  . CHOLECYSTECTOMY  2009  . OOPHORECTOMY    . scoliosis stabilization  1979  . SPINAL FUSION    . TOTAL ABDOMINAL HYSTERECTOMY W/ BILATERAL SALPINGOOPHORECTOMY  2007  . TREADMILL STRESS ECHOCARDIOGRAM  02/20/2017   Exercised 9:30 min - 10.9 METS.  83% MPHR (139 BPM) No CP. Frequent PVCs noted both at rest and stress with occasional ventricular bigeminy.  No VT.  EF baseline 55% improved to 70% with no regional wall motion normalities.-Nonischemic.  Excellent exercise tolerance.     Allergies  No Known Allergies  History of Present Illness    [name] has a PMH of elevated blood pressure, PVCs, coronary artery calcification-seen on CT scan, and hyperlipidemia.  Her PMH also includes anxiety/depression, and remote tobacco abuse.  She was initially seen by Dr. Harding in 2013.  During that time she was felt to be stable and only needs to be seen as needed.  She was seen by Dr. Revankar 02/2007 after stress echo to evaluate for frequent PVCs.  During that time she was noted to have excellent exercise tolerance with no major problems.  She did note fatigue with beta-blocker therapy.  She was seen by Dr. Harding on 1/20.  During that  time she was walking 2 times daily and denied chest pain or pressure.  She denied palpitations but did note trigger PVCs.  She had a cardiac event monitor 04/10/2020 which showed primary sinus rhythm, isolated PACs, frequent PVCs 16.4%, and no A. fib/flutter, pauses, NSVT/VT.  A calcium scoring was also ordered 03/20/2019 which showed calcium score of 18 which placed her in the 85th percentile for age and sex matched control.  On follow-up she reported she was doing well.  She reported controlled blood pressure at home.  She did not feel her PVCs and was asymptomatic at that time.  It was felt that further evaluation was needed and exercise tolerance test was ordered.  She underwent stress testing on 05/15/2020.  It showed hypertensive response to exercise, PVCs, and ST depression in inferior lateral leads with borderline ST elevation in aVR and aVL.  Cardiac catheterization was recommended.  She presents the clinic today for follow-up evaluation states she continues to be very physically active walking 3-4 miles per day.  She does note some occasional periods of shortness of breath.  However she denies lower extremity swelling chest pain.  We reviewed her coronary calcium score and her stress testing.  We reviewed both the coronary CTA and cardiac catheterization.  At this time she wishes to proceed with cardiac catheterization.  Risks and benefits reviewed.  She expressed understanding.  Her biggest concern at this   time was discussing the cardiac catheterization with her elderly parents.  We will have her follow-up after cardiac catheterization.  Today she denies chest pain, shortness of breath, lower extremity edema, fatigue, palpitations, melena, hematuria, hemoptysis, diaphoresis, weakness, presyncope, syncope, orthopnea, and PND.   Home Medications    Prior to Admission medications   Medication Sig Start Date End Date Taking? Authorizing Provider  ALPRAZolam (XANAX) 0.25 MG tablet Take 0.25 mg by  mouth every 8 (eight) hours as needed for anxiety.    [provider]  atorvastatin (LIPITOR) 20 MG tablet Take 20 mg by mouth daily. 01/05/20   [provider]  buPROPion (WELLBUTRIN XL) 150 MG 24 hr tablet Take 150 mg by mouth every morning. 01/29/17   [provider]  cholecalciferol (VITAMIN D3) 25 MCG (1000 UNIT) tablet 1 tablet    [provider]  metoprolol tartrate (LOPRESSOR) 100 MG tablet Take 1 tablet (100 mg total) two hours prior to CT scan. 05/18/20   Marykay Lex, MD  Multiple Vitamins-Calcium (ONE-A-DAY WOMENS PO) Take 1 tablet by mouth daily.    [provider]  nadolol (CORGARD) 80 MG tablet Take 1 tablet (80 mg total) by mouth daily. 05/05/20   Marykay Lex, MD  senna (SENOKOT) 8.6 MG tablet Take 1 tablet by mouth as needed.    [provider]  sertraline (ZOLOFT) 100 MG tablet Take 100 mg by mouth daily. 01/04/20   [provider]  vitamin C (ASCORBIC ACID) 500 MG tablet Take 500 mg by mouth daily.    [provider]    Family History    Family History  Problem Relation Age of Onset  . Hypertension Mother   . Graves' disease Mother   . Hypertension Father   . Polymyalgia rheumatica Father   . Hypertension Brother   . Breast cancer Paternal Grandmother        unsure of age   She indicated that her mother is alive. She indicated that her father is alive. She indicated that her brother is alive. She indicated that the status of her paternal grandmother is unknown. She indicated that her daughter is alive. She indicated that her son is alive.  Social History    Social History   Socioeconomic History  . Marital status: Divorced    Spouse name: Not on file  . Number of children: 2  . Years of education: Not on file  . Highest education level: Not on file  Occupational History  . Occupation: Investment banker, corporate: [employer]  Tobacco Use  . Smoking status: Former Smoker    Packs/day:  0.25    Years: 20.00    Pack years: 5.00    Types: Cigarettes    Quit date: 11/24/2016    Years since quitting: 3.4  . Smokeless tobacco: Never Used  . Tobacco comment: Currently using Juul cigarettes  Vaping Use  . Vaping Use: Every day  . Start date: 03/23/2016  . Devices: JUUL  Substance and Sexual Activity  . Alcohol use: Yes  . Drug use: No  . Sexual activity: Not on file  Other Topics Concern  . Not on file  Social History Narrative   Walks 1-2 times per day most days of the week.      Currently working from home with the same company.      Now that she is divorced, she is a single mother.  (Her daughter is adopted at age 6).  Son  is alive and well at 76 years old.   Social Determinants of Health   Financial Resource Strain: Not on file  Food Insecurity: Not on file  Transportation Needs: Not on file  Physical Activity: Not on file  Stress: Not on file  Social Connections: Not on file  Intimate Partner Violence: Not on file     Review of Systems    General:  No chills, fever, night sweats or weight changes.  Cardiovascular:  No chest pain, dyspnea on exertion, edema, orthopnea, palpitations, paroxysmal nocturnal dyspnea. Dermatological: No rash, lesions/masses Respiratory: No cough, dyspnea Urologic: No hematuria, dysuria Abdominal:   No nausea, vomiting, diarrhea, bright red blood per rectum, melena, or hematemesis Neurologic:  No visual changes, wkns, changes in mental status. All other systems reviewed and are otherwise negative except as noted above.  Physical Exam    VS:  BP 102/80   Pulse (!) 51   Ht 5\' 7"  (1.702 m)   Wt 171 lb 9.6 oz (77.8 kg)   SpO2 99%   BMI 26.88 kg/m  , BMI Body mass index is 26.88 kg/m. GEN: Well nourished, well developed, in no acute distress. HEENT: normal. Neck: Supple, no JVD, carotid bruits, or masses. Cardiac: RRR, no murmurs, rubs, or gallops. No clubbing, cyanosis, edema.  Radials/DP/PT 2+ and equal bilaterally.   Respiratory:  Respirations regular and unlabored, clear to auscultation bilaterally. GI: Soft, nontender, nondistended, BS + x 4. MS: no deformity or atrophy. Skin: warm and dry, no rash. Neuro:  Strength and sensation are intact. Psych: Normal affect.  Accessory Clinical Findings    Recent Labs: No results found for requested labs within last 8760 hours.   Recent Lipid Panel No results found for: CHOL, TRIG, HDL, CHOLHDL, VLDL, LDLCALC, LDLDIRECT  ECG personally reviewed by me today-none today.  Cardiac event monitor 04/10/2020  Primarily Sinus Rhythm-heart rate ranged from 56 to 144 bpm with an average rate of 81 bpm.  Rare isolated PACs.  Frequent isolated PVCS (16.4%) with rare couplets, triplets and bigeminy/trigeminy.  No arrhythmias: Atrial fibrillation, atrial flutter, SVT, NSVT/VT or significant bradycardia/pauses    Patch Wear Time:  6 days and 20 hours (2022-01-20T19:18:51-0500 to 2022-01-27T16:15:20-0500) Still has frequent PVCs.   2020-04-04, MD   Exercise tolerance test 05/15/2020  Blood pressure demonstrated a hypertensive response to exercise.  Horizontal ST segment depression ST segment depression of 3 mm was noted during stress in the II, III, aVF, V5 and V6 leads.   Positive stress test, with horizontal and downsloping ST depressions in inferolateral leads. There is also borderline ST elevation in aVR and aVL. Strip reviewed by DOD Dr. 07/15/2020 after time of test. Findings communicated with Dr. [place].   Assessment & Plan   1.  Abnormal stress test/coronary calcification -no chest pain today.  Coronary calcium score 18.  Underwent stress testing 05/15/2020 which was positive with horizontal and downsloping ST depressions in the inferior lateral leads.  Details above.  Cardiac catheterization recommended. Continue atorvastatin, nadolol Heart healthy low-sodium diet-salty 6 given Schedule cardiac catheterization  Shared Decision Making/Informed  Consent The risks [stroke (1 in 1000), death (1 in 1000), kidney failure [usually temporary] (1 in 500), bleeding (1 in 200), allergic reaction [possibly serious] (1 in 200)], benefits (diagnostic support and management of coronary artery disease) and alternatives of a cardiac catheterization were discussed in detail with Ms. Towles and she is willing to proceed.    Elevated blood pressure-BP today 102/80.  Well-controlled at home. Continue nadolol Heart  healthy low-sodium diet-salty 6 given  Frequent PVCs-remains cardiac unaware.  16.4% PVC burden noted on cardiac monitor. Continue nadolol Heart healthy low-sodium diet-salty 6 given Avoid triggers caffeine, chocolate, EtOH, dehydration etc.  Hyperlipidemia-LDL 74 on previous labs. Continue Atorvastatin Heart healthy low-sodium high-fiber diet   Disposition: Follow-up with Dr. Herbie Baltimore after cardiac catheterization.   Thomasene Ripple. Leyli Kevorkian NP-C    05/22/2020, 12:38 PM [facility] [address] Office [phone] Fax [phone]  Notice: This dictation was prepared with Dragon dictation along with smaller phrase technology. Any transcriptional errors that result from this process are unintentional and may not be corrected upon review.  I spent 20 minutes examining this patient, reviewing medications, and using patient centered shared decision making involving her cardiac care.  Prior to her visit I spent greater than 20 minutes reviewing her past medical history,  medications, and prior cardiac tests.

## 2020-05-22 ENCOUNTER — Other Ambulatory Visit: Payer: Self-pay

## 2020-05-22 ENCOUNTER — Ambulatory Visit: Payer: 59 | Admitting: General Practice

## 2020-05-22 ENCOUNTER — Encounter: Payer: Self-pay | Admitting: General Practice

## 2020-05-22 VITALS — BP 102/80 | HR 51 | Ht 67.0 in | Wt 171.6 lb

## 2020-05-22 DIAGNOSIS — E785 Hyperlipidemia, unspecified: Secondary | ICD-10-CM | POA: Diagnosis not present

## 2020-05-22 DIAGNOSIS — I2510 Atherosclerotic heart disease of native coronary artery without angina pectoris: Secondary | ICD-10-CM | POA: Diagnosis not present

## 2020-05-22 DIAGNOSIS — Z01810 Encounter for preprocedural cardiovascular examination: Secondary | ICD-10-CM

## 2020-05-22 DIAGNOSIS — I493 Ventricular premature depolarization: Secondary | ICD-10-CM

## 2020-05-22 DIAGNOSIS — R030 Elevated blood-pressure reading, without diagnosis of hypertension: Secondary | ICD-10-CM

## 2020-05-22 NOTE — Patient Instructions (Addendum)
  Katherine Brock  05/22/2020  You are scheduled for a Cardiac Catheterization on Friday, March 25 with Dr. Bryan Lemma.  1. Please arrive at the [facility] (Main Entrance A) at [facility]: [address] [place], [place] [address] at 7:00 AM (This time is two hours before your procedure to ensure your preparation). Free valet parking service is available.   Special note: Every effort is made to have your procedure done on time. Please understand that emergencies sometimes delay scheduled procedures.  2. Diet: Do not eat solid foods after midnight.  The patient may have clear liquids until 5am upon the day of the procedure.  3. Labs: You will need to have blood drawn on Friday, March 18 at [facility] [address], [place]  Open: 8am - 5pm (Lunch 12:30 - 1:30)   Phone: [phone]. You do not need to be fasting. Medication instructions in preparation for your procedure:  COVID TESTING Tuesday [facility] 22,2022 @ 9:15AM. YOU MUST SELF- QUARANTINE AFTER COVID TESTING UNTIL YOU LEAVE FOR PROCEDURE ON 05-29-2020. AS TO NOT EXPOSE YOURSELF TO GETTING INFECTED BEFORE YOUR CARDIOVIERSION ADMISSION.  Contrast Allergy: No  On the morning of your procedure, take your Aspirin and any morning medicines NOT listed above.  You may use sips of water.  5. Plan for one night stay--bring personal belongings. 6. Bring a current list of your medications and current insurance cards. 7. You MUST have a responsible person to drive you home. 8. Someone MUST be with you the first 24 hours after you arrive home or your discharge will be delayed. 9. Please wear clothes that are easy to get on and off and wear slip-on shoes.  Thank you for allowing [place] to care for you!   -- [facility]

## 2020-05-22 NOTE — Addendum Note (Signed)
Addended by: Alyson Ingles on: 05/22/2020 01:31 PM   Modules accepted: Orders

## 2020-05-23 LAB — CBC
Hematocrit: 38.4 % (ref 34.0–46.6)
Hemoglobin: 12.6 g/dL (ref 11.1–15.9)
MCH: 30 pg (ref 26.6–33.0)
MCHC: 32.8 g/dL (ref 31.5–35.7)
MCV: 91 fL (ref 79–97)
Platelets: 254 10*3/uL (ref 150–450)
RBC: 4.2 x10E6/uL (ref 3.77–5.28)
RDW: 12.1 % (ref 11.7–15.4)
WBC: 6.8 10*3/uL (ref 3.4–10.8)

## 2020-05-23 LAB — BASIC METABOLIC PANEL
BUN/Creatinine Ratio: 18 (ref 9–23)
BUN: 14 mg/dL (ref 6–24)
CO2: 21 mmol/L (ref 20–29)
Calcium: 9.1 mg/dL (ref 8.7–10.2)
Chloride: 106 mmol/L (ref 96–106)
Creatinine, Ser: 0.79 mg/dL (ref 0.57–1.00)
Glucose: 87 mg/dL (ref 65–99)
Potassium: 4.6 mmol/L (ref 3.5–5.2)
Sodium: 142 mmol/L (ref 134–144)
eGFR: 88 mL/min/{1.73_m2} (ref >59)

## 2020-05-26 ENCOUNTER — Other Ambulatory Visit (HOSPITAL_COMMUNITY)
Admission: RE | Admit: 2020-05-26 | Discharge: 2020-05-26 | Disposition: A | Discharge status: Home / Self Care | Payer: 59 | Source: Ambulatory Visit | Attending: Cardiology | Admitting: Cardiology

## 2020-05-26 DIAGNOSIS — Z20822 Contact with and (suspected) exposure to covid-19: Secondary | ICD-10-CM | POA: Diagnosis not present

## 2020-05-26 DIAGNOSIS — Z01812 Encounter for preprocedural laboratory examination: Secondary | ICD-10-CM | POA: Diagnosis present

## 2020-05-26 LAB — SARS CORONAVIRUS 2 (TAT 6-24 HRS): SARS Coronavirus 2: NEGATIVE

## 2020-05-27 ENCOUNTER — Other Ambulatory Visit: Payer: Self-pay | Admitting: *Deleted

## 2020-05-28 ENCOUNTER — Telehealth: Payer: Self-pay | Admitting: *Deleted

## 2020-05-28 NOTE — Telephone Encounter (Signed)
Pt contacted pre-catheterization scheduled at [facility] for: Friday May 29, 2020 9 AM Verified arrival time and place: [employer] Main Entrance A [facility]) at: 7 AM   No solid food after midnight prior to cath, clear liquids until 5 AM day of procedure.  AM meds can be  taken pre-cath with sips of water including: ASA 81 mg   Confirmed patient has responsible adult to drive home post procedure and be with patient first 24 hours after arriving home: yes  You are allowed ONE visitor in the waiting room during the time you are at the hospital for your procedure. Both you and your visitor must wear a mask once you enter the hospital.   Reviewed procedure/mask/visitor instructions with patient.

## 2020-05-29 ENCOUNTER — Other Ambulatory Visit (HOSPITAL_COMMUNITY): Payer: 59

## 2020-05-29 ENCOUNTER — Ambulatory Visit (HOSPITAL_BASED_OUTPATIENT_CLINIC_OR_DEPARTMENT_OTHER): Payer: 59

## 2020-05-29 ENCOUNTER — Ambulatory Visit (HOSPITAL_COMMUNITY)
Admission: RE | Admit: 2020-05-29 | Discharge: 2020-05-29 | Disposition: A | Discharge status: Home / Self Care | Payer: 59 | Attending: Cardiology | Admitting: Cardiology

## 2020-05-29 ENCOUNTER — Encounter (HOSPITAL_COMMUNITY)
Admission: RE | Disposition: A | Discharge status: Home / Self Care | Payer: Self-pay | Source: Home / Self Care | Attending: Cardiology

## 2020-05-29 ENCOUNTER — Ambulatory Visit: Admit: 2020-05-29 | Payer: 59 | Admitting: Cardiology

## 2020-05-29 ENCOUNTER — Other Ambulatory Visit: Payer: Self-pay

## 2020-05-29 DIAGNOSIS — R9431 Abnormal electrocardiogram [ECG] [EKG]: Secondary | ICD-10-CM | POA: Diagnosis not present

## 2020-05-29 DIAGNOSIS — Z87891 Personal history of nicotine dependence: Secondary | ICD-10-CM | POA: Diagnosis not present

## 2020-05-29 DIAGNOSIS — Z981 Arthrodesis status: Secondary | ICD-10-CM | POA: Diagnosis not present

## 2020-05-29 DIAGNOSIS — Z90710 Acquired absence of both cervix and uterus: Secondary | ICD-10-CM | POA: Insufficient documentation

## 2020-05-29 DIAGNOSIS — R9439 Abnormal result of other cardiovascular function study: Secondary | ICD-10-CM | POA: Diagnosis not present

## 2020-05-29 DIAGNOSIS — E785 Hyperlipidemia, unspecified: Secondary | ICD-10-CM | POA: Insufficient documentation

## 2020-05-29 DIAGNOSIS — Z9049 Acquired absence of other specified parts of digestive tract: Secondary | ICD-10-CM | POA: Diagnosis not present

## 2020-05-29 DIAGNOSIS — I10 Essential (primary) hypertension: Secondary | ICD-10-CM | POA: Insufficient documentation

## 2020-05-29 DIAGNOSIS — I361 Nonrheumatic tricuspid (valve) insufficiency: Secondary | ICD-10-CM

## 2020-05-29 DIAGNOSIS — Z79899 Other long term (current) drug therapy: Secondary | ICD-10-CM | POA: Diagnosis not present

## 2020-05-29 DIAGNOSIS — I2510 Atherosclerotic heart disease of native coronary artery without angina pectoris: Secondary | ICD-10-CM | POA: Insufficient documentation

## 2020-05-29 DIAGNOSIS — I493 Ventricular premature depolarization: Secondary | ICD-10-CM | POA: Diagnosis not present

## 2020-05-29 DIAGNOSIS — Z90722 Acquired absence of ovaries, bilateral: Secondary | ICD-10-CM | POA: Insufficient documentation

## 2020-05-29 DIAGNOSIS — Z8249 Family history of ischemic heart disease and other diseases of the circulatory system: Secondary | ICD-10-CM | POA: Diagnosis not present

## 2020-05-29 HISTORY — PX: TRANSTHORACIC ECHOCARDIOGRAM: SHX275

## 2020-05-29 HISTORY — PX: LEFT HEART CATH AND CORONARY ANGIOGRAPHY: CATH118249

## 2020-05-29 LAB — ECHOCARDIOGRAM COMPLETE
Area-P 1/2: 3.65 cm2
Calc EF: 57.5 %
Height: 67 in
S' Lateral: 2.7 cm
Single Plane A2C EF: 51.8 %
Single Plane A4C EF: 60.9 %
Weight: 2720 oz

## 2020-05-29 SURGERY — LEFT HEART CATH AND CORONARY ANGIOGRAPHY
Anesthesia: LOCAL

## 2020-05-29 SURGERY — LEFT HEART CATH
Anesthesia: Moderate Sedation | Laterality: Left

## 2020-05-29 MED ORDER — FENTANYL CITRATE (PF) 100 MCG/2ML IJ SOLN
INTRAMUSCULAR | Status: DC | PRN
  Administered 2020-05-29: 25 ug via INTRAVENOUS

## 2020-05-29 MED ORDER — SODIUM CHLORIDE 0.9 % IV SOLN: 250.0000 mL | INTRAVENOUS | Status: DC | PRN

## 2020-05-29 MED ORDER — SODIUM CHLORIDE 0.9 % IV SOLN: INTRAVENOUS | Status: AC

## 2020-05-29 MED ORDER — ONDANSETRON HCL 4 MG/2ML IJ SOLN: 4.0000 mg | Freq: Four times a day (QID) | INTRAMUSCULAR | Status: DC | PRN

## 2020-05-29 MED ORDER — SODIUM CHLORIDE 0.9% FLUSH: 3.0000 mL | Freq: Two times a day (BID) | INTRAVENOUS | Status: DC

## 2020-05-29 MED ORDER — ASPIRIN 81 MG PO CHEW: 81.0000 mg | CHEWABLE_TABLET | ORAL | Status: DC

## 2020-05-29 MED ORDER — SODIUM CHLORIDE 0.9% FLUSH: 3.0000 mL | INTRAVENOUS | Status: DC | PRN

## 2020-05-29 MED ORDER — LIDOCAINE HCL (PF) 1 % IJ SOLN
INTRAMUSCULAR | Status: DC | PRN
  Administered 2020-05-29: 2 mL

## 2020-05-29 MED ORDER — FENTANYL CITRATE (PF) 100 MCG/2ML IJ SOLN
INTRAMUSCULAR | Status: AC
  Filled 2020-05-29: qty 2

## 2020-05-29 MED ORDER — HYDRALAZINE HCL 20 MG/ML IJ SOLN: 10.0000 mg | INTRAMUSCULAR | Status: DC | PRN

## 2020-05-29 MED ORDER — HEPARIN (PORCINE) IN NACL 1000-0.9 UT/500ML-% IV SOLN
INTRAVENOUS | Status: DC | PRN
  Administered 2020-05-29 (×2): 500 mL

## 2020-05-29 MED ORDER — HEPARIN SODIUM (PORCINE) 1000 UNIT/ML IJ SOLN
INTRAMUSCULAR | Status: DC | PRN
  Administered 2020-05-29: 4000 [IU] via INTRAVENOUS

## 2020-05-29 MED ORDER — VERAPAMIL HCL 2.5 MG/ML IV SOLN
INTRAVENOUS | Status: DC | PRN
  Administered 2020-05-29: 10 mL via INTRA_ARTERIAL

## 2020-05-29 MED ORDER — IOHEXOL 350 MG/ML SOLN
INTRAVENOUS | Status: DC | PRN
  Administered 2020-05-29: 60 mL

## 2020-05-29 MED ORDER — SODIUM CHLORIDE 0.9 % WEIGHT BASED INFUSION: 1.0000 mL/kg/h | INTRAVENOUS | Status: DC

## 2020-05-29 MED ORDER — MIDAZOLAM HCL 2 MG/2ML IJ SOLN
INTRAMUSCULAR | Status: DC | PRN
  Administered 2020-05-29: 2 mg via INTRAVENOUS

## 2020-05-29 MED ORDER — LIDOCAINE HCL (PF) 1 % IJ SOLN
INTRAMUSCULAR | Status: AC
  Filled 2020-05-29: qty 30

## 2020-05-29 MED ORDER — VERAPAMIL HCL 2.5 MG/ML IV SOLN
INTRAVENOUS | Status: AC
  Filled 2020-05-29: qty 2

## 2020-05-29 MED ORDER — SODIUM CHLORIDE 0.9 % WEIGHT BASED INFUSION
3.0000 mL/kg/h | INTRAVENOUS | Status: AC
  Administered 2020-05-29: 3 mL/kg/h via INTRAVENOUS

## 2020-05-29 MED ORDER — ACETAMINOPHEN 325 MG PO TABS: 650.0000 mg | ORAL_TABLET | ORAL | Status: DC | PRN

## 2020-05-29 MED ORDER — MIDAZOLAM HCL 2 MG/2ML IJ SOLN
INTRAMUSCULAR | Status: AC
  Filled 2020-05-29: qty 2

## 2020-05-29 MED ORDER — HEPARIN (PORCINE) IN NACL 1000-0.9 UT/500ML-% IV SOLN
INTRAVENOUS | Status: AC
  Filled 2020-05-29: qty 1500

## 2020-05-29 MED ORDER — LABETALOL HCL 5 MG/ML IV SOLN: 10.0000 mg | INTRAVENOUS | Status: DC | PRN

## 2020-05-29 MED ORDER — HEPARIN SODIUM (PORCINE) 1000 UNIT/ML IJ SOLN
INTRAMUSCULAR | Status: AC
  Filled 2020-05-29: qty 1

## 2020-05-29 SURGICAL SUPPLY — 10 items
CATH INFINITI 5FR ANG PIGTAIL (CATHETERS) ×1 IMPLANT
CATH OPTITORQUE TIG 4.0 5F (CATHETERS) ×1 IMPLANT
DEVICE RAD COMP TR BAND LRG (VASCULAR PRODUCTS) ×1 IMPLANT
GLIDESHEATH SLEND SS 6F .021 (SHEATH) ×1 IMPLANT
GUIDEWIRE INQWIRE 1.5J.035X260 (WIRE) IMPLANT
INQWIRE 1.5J .035X260CM (WIRE) ×2
KIT HEART LEFT (KITS) ×2 IMPLANT
PACK CARDIAC CATHETERIZATION (CUSTOM PROCEDURE TRAY) ×2 IMPLANT
TRANSDUCER W/STOPCOCK (MISCELLANEOUS) ×2 IMPLANT
TUBING CIL FLEX 10 FLL-RA (TUBING) ×2 IMPLANT

## 2020-05-29 NOTE — Discharge Instructions (Signed)
Radial Site Care  This sheet gives you information about how to care for yourself after your procedure. Your health care provider may also give you more specific instructions. If you have problems or questions, contact your health care provider. What can I expect after the procedure? After the procedure, it is common to have:  Bruising and tenderness at the catheter insertion area. Follow these instructions at home: Medicines  Take over-the-counter and prescription medicines only as told by your health care provider. Insertion site care 1. Follow instructions from your health care provider about how to take care of your insertion site. Make sure you: ? Wash your hands with soap and water before you remove your bandage (dressing). If soap and water are not available, use hand sanitizer. ? May remove dressing in 24 hours. 2. Check your insertion site every day for signs of infection. Check for: ? Redness, swelling, or pain. ? Fluid or blood. ? Pus or a bad smell. ? Warmth. 3. Do no take baths, swim, or use a hot tub for 5 days. 4. You may shower 24-48 hours after the procedure. ? Remove the dressing and gently wash the site with plain soap and water. ? Pat the area dry with a clean towel. ? Do not rub the site. That could cause bleeding. 5. Do not apply powder or lotion to the site. Activity  1. For 24 hours after the procedure, or as directed by your health care provider: ? Do not flex or bend the affected arm. ? Do not push or pull heavy objects with the affected arm. ? Do not drive yourself home from the hospital or clinic. You may drive 24 hours after the procedure. ? Do not operate machinery or power tools. ? KEEP ARM ELEVATED THE REMAINDER OF THE DAY. 2. Do not push, pull or lift anything that is heavier than 10 lb for 5 days. 3. Ask your health care provider when it is okay to: ? Return to work or school. ? Resume usual physical activities or sports. ? Resume sexual  activity. General instructions  If the catheter site starts to bleed, raise your arm and put firm pressure on the site. If the bleeding does not stop, get help right away. This is a medical emergency.  DRINK PLENTY OF FLUIDS FOR THE NEXT 2-3 DAYS.  No alcohol consumption for 24 hours after receiving sedation.  If you went home on the same day as your procedure, a responsible adult should be with you for the first 24 hours after you arrive home.  Keep all follow-up visits as told by your health care provider. This is important. Contact a health care provider if:  You have a fever.  You have redness, swelling, or yellow drainage around your insertion site. Get help right away if:  You have unusual pain at the radial site.  The catheter insertion area swells very fast.  The insertion area is bleeding, and the bleeding does not stop when you hold steady pressure on the area.  Your arm or hand becomes pale, cool, tingly, or numb. These symptoms may represent a serious problem that is an emergency. Do not wait to see if the symptoms will go away. Get medical help right away. Call your local emergency services (911 in the [place] Do not drive yourself to the hospital. Summary  After the procedure, it is common to have bruising and tenderness at the site.  Follow instructions from your health care provider about how to take care   of your radial site wound. Check the wound every day for signs of infection.  This information is not intended to replace advice given to you by your health care provider. Make sure you discuss any questions you have with your health care provider. Document Revised: 03/29/2017 Document Reviewed: 03/29/2017 Elsevier Patient Education  2020 Elsevier Inc. 

## 2020-05-29 NOTE — Research (Signed)
Identify Informed Consent   Subject Name: Katherine Brock  Subject met inclusion and exclusion criteria.  The informed consent form, study requirements and expectations were reviewed with the subject and questions and concerns were addressed prior to the signing of the consent form.  The subject verbalized understanding of the trial requirements.  The subject agreed to participate in the Identify trial and signed the informed consent at 0735 on 05/29/2020.  The informed consent was obtained prior to performance of any protocol-specific procedures for the subject.  A copy of the signed informed consent was given to the subject and a copy was placed in the subject's medical record.   [name]

## 2020-05-29 NOTE — Interval H&P Note (Signed)
History and Physical Interval Note:  05/29/2020 9:07 AM  Katherine Brock  has presented today for surgery, with the diagnosis of abnormal stress test, frequent PVCs.  The various methods of treatment have been discussed with the patient and family. After consideration of risks, benefits and other options for treatment, the patient has consented to  Procedure(s): LEFT HEART CATH AND CORONARY ANGIOGRAPHY (N/A)  PERCUTANEOUS CORONARY INTERVENTION   as a surgical intervention.  The patient's history has been reviewed, patient examined, no change in status, stable for surgery.  I have reviewed the patient's chart and labs.  Questions were answered to the patient's satisfaction.    Cath Lab Visit (complete for each Cath Lab visit)  Clinical Evaluation Leading to the Procedure:   ACS: No.  Non-ACS:    Anginal Classification: No Symptoms  Anti-ischemic medical therapy: Minimal Therapy (1 class of medications)  Non-Invasive Test Results: Intermediate-risk stress test findings: cardiac mortality 1-3%/year  Prior CABG: No previous CABG    Bryan Lemma

## 2020-05-29 NOTE — Progress Notes (Signed)
Pt ambulated without difficulty or bleeding.   Discharged home with mother who will drive pt home. Pt's friend Misty Stanley is to stay with pt x 24 hrs

## 2020-05-29 NOTE — Progress Notes (Signed)
  Echocardiogram 2D Echocardiogram has been performed.  Janalyn Harder 05/29/2020, 11:15 AM

## 2020-06-01 ENCOUNTER — Encounter (HOSPITAL_COMMUNITY): Payer: Self-pay | Admitting: Cardiology

## 2020-06-08 ENCOUNTER — Ambulatory Visit: Payer: 59 | Admitting: Cardiology

## 2020-06-08 ENCOUNTER — Other Ambulatory Visit: Payer: Self-pay

## 2020-06-08 ENCOUNTER — Encounter: Payer: Self-pay | Admitting: Cardiology

## 2020-06-08 VITALS — BP 118/72 | HR 52 | Ht 67.0 in | Wt 169.0 lb

## 2020-06-08 DIAGNOSIS — E785 Hyperlipidemia, unspecified: Secondary | ICD-10-CM | POA: Diagnosis not present

## 2020-06-08 DIAGNOSIS — I2510 Atherosclerotic heart disease of native coronary artery without angina pectoris: Secondary | ICD-10-CM | POA: Diagnosis not present

## 2020-06-08 DIAGNOSIS — I493 Ventricular premature depolarization: Secondary | ICD-10-CM | POA: Diagnosis not present

## 2020-06-08 DIAGNOSIS — R030 Elevated blood-pressure reading, without diagnosis of hypertension: Secondary | ICD-10-CM | POA: Diagnosis not present

## 2020-06-08 NOTE — Progress Notes (Signed)
Primary Care Provider: Merri BrunettePharr, Walter, MD Cardiologist: Bryan Lemmaavid Laylia Mui, MD Electrophysiologist: None  Clinic Note: Chief Complaint  Patient presents with  . Follow-up    1-2 months post echo and cath.  . Palpitations    Frequent PVCs noted on monitor.  Relatively asymptomatic.  Normal cath and normal echo.   ===================================  ASSESSMENT/PLAN   Problem List Items Addressed This Visit    Frequent unifocal PVCs - Primary (Chronic)    Pretty asymptomatic PVCs, but pretty high burden.  They seem to have stabilized a little bit on beta-blocker.  Did not increase with exertion.  Despite having abnormal GXT, cardiac catheterization showed normal coronary arteries and echocardiogram was essentially normal as well.  Therefore no structural abnormalities or ischemic etiology indicated.    Plan: Continue current dose of nadolol, recheck 3-day Zio patch monitor prior to follow-up to assess burden on beta-blocker.  If the burden still remains high, may consider referral to EP.      Relevant Orders   LONG TERM MONITOR (3-14 DAYS)   Elevated blood-pressure reading without diagnosis of hypertension (Chronic)    Blood pressure is pretty well controlled and she is on the Brabham low-dose long-acting beta-blocker and tolerating it relatively well.  She had issues with fatigue using beta-blockers in the past, but seems to be doing okay.      Relevant Orders   LONG TERM MONITOR (3-14 DAYS)   Coronary artery calcification seen on CAT scan (Chronic)    Coronary calcium score was 18, and cardiac catheterization was essentially normal.  Overall relatively mild risk.  Continue statin.  No true indication for aspirin.  She is on beta-blocker for PVCs.      Relevant Orders   LONG TERM MONITOR (3-14 DAYS)   Hyperlipidemia with target LDL less than 100 (Chronic)    Last check lipids were pretty well controlled with LDL 74 on 20 mg atorvastatin.  Well within target range based on her  coronary calcium score and relatively normal coronary angiogram.        ===================================  HPI:    Katherine Brock is a 55 y.o. female former smoker (currently using JUUL E- cigarettes) with a PMH notable for frequent PVCs, Elevated Coronary Calcium Score with abnormal GXT along with anxiety and depression below who presents today for post-cath follow-up.  She was initially seen in consultation for coronary calcification noted on CT scan at the request of Dr. Ritta SlotWalter [place].  She had a nonischemic treadmill stress echocardiogram done back in 2018 for frequent PVCs.  Katherine Brock was last seen by me on 05/05/2020 to follow-up results of her Coronary Calcium Score and Zio Patch Monitor.  Coronary calcium score was only 18, but the Zio patch monitor showed 16.4% PVCs.  Apparently she does not necessarily notice the PVCs unless she is stressed or anxious.  Otherwise pretty asymptomatic.  Plan for evaluation was GXT and 2D echocardiogram.  (Reviewed below) => She was seen by Edd FabianJesse Cleaver, NP to follow-up after her abnormal Treadmill Stress Test.  Based on the highly positive results, recommendation was to proceed with cardiac catheterization for diagnostic purposes.  Recent Hospitalizations: Cardiac Cath 05/29/2020  Reviewed  CV studies:    The following studies were reviewed today: (if available, images/films reviewed: From Epic Chart or Care Everywhere) . ETT 05/15/2020: Exercised 8:35 min. Peak HR 164 = 99% MPHR (reached 85% @ 6 min). 10.1 METS. HTN response. Horizontal-downsloping ST depressions ~ 3 mm in II, III, aVF, V5&V6. = ++  TM GXT.  Marland Kitchen Left Heart Cath 05/29/2020: Normal EF.  Normal EDP.  Angiographically normal coronary arteries.  Tortuous and relatively small caliber.  . Echocardiogram 05/29/2020: EF 60-65%. No RWMA. Normal Diastolic pressures. Normal RV.  Normal atrial sizes.  Normal valves.   Interval History:   Katherine Brock presents here today overall pretty  stable.  No major issues.  She still has not really feel the palpitations but about the only thing she notes is that she is more than the shape and gets short of breath with doing more than usual exertion.  She denies any chest pain or pressure.  She does not notice the PVCs that are noted on monitor.  About the other times she notes palpitations when she is feeling anxious or nervous.  She seems to be tolerating anastrozole without any major issues.  Energy level has not waned.  CV Review of Symptoms (Summary): positive for - dyspnea on exertion, irregular heartbeat and Exercise intolerance, more because of deconditioning. negative for - chest pain, edema, orthopnea, paroxysmal nocturnal dyspnea, rapid heart rate, shortness of breath or Lightheadedness or dizziness or wooziness, syncope/near syncope or TIA/amaurosis fugax, claudication.  The patient does not have symptoms concerning for COVID-19 infection (fever, chills, cough, or new shortness of breath).   REVIEWED OF SYSTEMS   Review of Systems  Constitutional: Negative for malaise/fatigue and weight loss.  HENT: Negative for congestion and nosebleeds.   Respiratory: Positive for cough (Still has a mild smoker's cough), shortness of breath (With exertion) and wheezing (With allergies more than anything else now.).   Gastrointestinal: Negative for abdominal pain, blood in stool and melena.  Genitourinary: Negative for hematuria.  Musculoskeletal: Negative for falls and joint pain.  Neurological: Negative for dizziness, focal weakness, weakness and headaches.  Psychiatric/Behavioral: Positive for depression (Well-controlled). Negative for memory loss. The patient is nervous/anxious (Pretty well controlled). The patient does not have insomnia.    I have reviewed and (if needed) personally updated the patient's problem list, medications, allergies, past medical and surgical history, social and family history.   PAST MEDICAL HISTORY   Past  Medical History:  Diagnosis Date  . Agatston coronary artery calcium score less than 100 03/2020   Coronary calcium score 18  . Anxiety   . Chronic back pain    hx of childhood scoliosis, has stabilization rods in back  . Depression   . Frequent PVCs 2018   Evaluated with Stress Echo -> nonischemic; 16.4 %: January 2022  . Kidney stones   . Tobacco dependence     PAST SURGICAL HISTORY   Past Surgical History:  Procedure Laterality Date  . CESAREAN SECTION  2001  . CHOLECYSTECTOMY  2009  . GRADUATED TREADMILL EXERCISE TOLERANCE TEST  05/15/2020    Exercised 8:35 min. Peak HR 164 = 99% MPHR (reached 85% @ 6 min). 10.1 METS. HTN response. Horizontal-downsloping ST depressions ~ 3 mm in II, III, aVF, V5&V6. = ++ TM GXT. => FALSE POSITIVE-NORMAL HEART CATH  . LEFT HEART CATH AND CORONARY ANGIOGRAPHY N/A 05/29/2020   Procedure: LEFT HEART CATH AND CORONARY ANGIOGRAPHY;  Surgeon: Marykay Lex, MD;  Location: [facility] INVASIVE CV LAB;  Service: Cardiovascular;  Laterality: N/A;;; Normal EF.  Normal EDP.  Angiographically normal coronary arteries.  Tortuous and relatively small caliber.  . OOPHORECTOMY    . scoliosis stabilization  1979  . SPINAL FUSION    . TOTAL ABDOMINAL HYSTERECTOMY W/ BILATERAL SALPINGOOPHORECTOMY  2007  . TRANSTHORACIC ECHOCARDIOGRAM  05/29/2020  EF 60-65%. No RWMA. Normal Diastolic pressures. Normal RV.  Normal atrial sizes.  Normal valves.  . TREADMILL STRESS ECHOCARDIOGRAM  02/20/2017   Exercised 9:30 min - 10.9 METS.  83% MPHR (139 BPM) No CP. Frequent PVCs noted both at rest and stress with occasional ventricular bigeminy.  No VT.  EF baseline 55% improved to 70% with no regional wall motion normalities.-Nonischemic.  Excellent exercise tolerance.     Immunization History  Administered Date(s) Administered  . Influenza Whole 12/05/2009    MEDICATIONS/ALLERGIES   Current Meds  Medication Sig  . ALPRAZolam (XANAX) 0.25 MG tablet Take 0.25 mg by mouth at  bedtime as needed for anxiety or sleep.  Marland Kitchen ascorbic acid (VITAMIN C) 500 MG tablet Take 500 mg by mouth in the morning.  Marland Kitchen atorvastatin (LIPITOR) 20 MG tablet Take 20 mg by mouth daily at 12 noon.  Marland Kitchen buPROPion (WELLBUTRIN XL) 150 MG 24 hr tablet Take 150 mg by mouth daily at 12 noon.  . cholecalciferol (VITAMIN D3) 25 MCG (1000 UNIT) tablet Take 1,000 Units by mouth in the morning.  . clindamycin (CLEOCIN T) 1 % external solution Apply 1 application topically every morning.  Marland Kitchen ibuprofen (ADVIL) 200 MG tablet Take 400 mg by mouth every 8 (eight) hours as needed (pain).  . Multiple Vitamin (MULTIVITAMIN WITH MINERALS) TABS tablet Take 1 tablet by mouth in the morning. One-A-Day for Women  . nadolol (CORGARD) 80 MG tablet Take 1 tablet (80 mg total) by mouth daily. (Patient taking differently: Take 80 mg by mouth at bedtime.)  . Polyethyl Glycol-Propyl Glycol (SYSTANE) 0.4-0.3 % SOLN Place 1-2 drops into both eyes 3 (three) times daily as needed (dry/irritated eyes).  . SENNOSIDES PO Take 25.5 mg by mouth at bedtime. Swiss Kriss Herbal Laxative Tablets  . sertraline (ZOLOFT) 100 MG tablet Take 100 mg by mouth daily at 12 noon.    No Known Allergies  SOCIAL HISTORY/FAMILY HISTORY   Reviewed in Epic:  Pertinent findings: no change in Family History Social History   Tobacco Use  . Smoking status: Former Smoker    Packs/day: 0.25    Years: 20.00    Pack years: 5.00    Types: Cigarettes    Quit date: 11/24/2016    Years since quitting: 3.5  . Smokeless tobacco: Never Used  . Tobacco comment: Currently using Juul cigarettes  Vaping Use  . Vaping Use: Every day  . Start date: 03/23/2016  . Devices: JUUL  Substance Use Topics  . Alcohol use: Yes  . Drug use: No   Social History   Social History Narrative   Walks 1-2 times per day most days of the week.      Currently working from home with the same company.      Now that she is divorced, she is a single mother.  (Her daughter is  adopted at age 39).  Son is alive and well at 17 years old.    OBJCTIVE -PE, EKG, labs   Wt Readings from Last 3 Encounters:  06/08/20 169 lb (76.7 kg)  05/29/20 170 lb (77.1 kg)  05/22/20 171 lb 9.6 oz (77.8 kg)    Physical Exam: BP 118/72 (BP Location: Left Arm, Patient Position: Sitting, Cuff Size: Normal)   Pulse (!) 52   Ht 5\' 7"  (1.702 m)   Wt 169 lb (76.7 kg)   BMI 26.47 kg/m  Physical Exam Vitals reviewed.  Constitutional:      General: She is not in acute distress.  Appearance: Normal appearance. She is normal weight. She is not ill-appearing or toxic-appearing.  HENT:     Head: Normocephalic and atraumatic.  Neck:     Vascular: No carotid bruit or JVD.  Cardiovascular:     Rate and Rhythm: Regular rhythm. Bradycardia present. Occasional extrasystoles are present.    Chest Wall: PMI is not displaced.     Pulses: Normal pulses.     Heart sounds: Normal heart sounds. No murmur heard. No friction rub. No gallop.   Pulmonary:     Effort: Pulmonary effort is normal. No respiratory distress.     Breath sounds: Normal breath sounds.  Chest:     Chest wall: No tenderness.  Musculoskeletal:        General: No swelling. Normal range of motion.     Cervical back: Normal range of motion and neck supple.  Skin:    General: Skin is warm and dry.  Neurological:     General: No focal deficit present.     Mental Status: She is alert and oriented to person, place, and time. Mental status is at baseline.     Motor: No weakness.     Gait: Gait normal.  Psychiatric:        Mood and Affect: Mood normal.        Behavior: Behavior normal.        Thought Content: Thought content normal.        Judgment: Judgment normal.     Adult ECG Report Not checked  Recent Labs:   12/13/2019 : TC 170, TG 53, HDL 85, LDL 74 Lab Results  Component Value Date   CREATININE 0.79 05/22/2020   BUN 14 05/22/2020   NA 142 05/22/2020   K 4.6 05/22/2020   CL 106 05/22/2020   CO2 21  05/22/2020   CBC Latest Ref Rng & Units 05/22/2020 06/26/2013 01/30/2008  WBC 3.4 - 10.8 x10E3/uL 6.8 5.8 8.9  Hemoglobin 11.1 - 15.9 g/dL 81.0 17.5 8.7 DELTA CHECK NOTED(L)  Hematocrit 34.0 - 46.6 % 38.4 44.5 25.6(L)  Platelets 150 - 450 x10E3/uL 254 215 222    No results found for: TSH  ==================================================  COVID-19 Education: The signs and symptoms of COVID-19 were discussed with the patient and how to seek care for testing (follow up with PCP or arrange E-visit).   The importance of social distancing and COVID-19 vaccination was discussed today. The patient is practicing social distancing & Masking.   I spent a total of 25 minutes with the patient spent in direct patient consultation.  Additional time spent with chart review  / charting (studies, outside notes, etc): 12 min Total Time: 37 min  Current medicines are reviewed at length with the patient today.  (+/- concerns) N/A  This visit occurred during the SARS-CoV-2 public health emergency.  Safety protocols were in place, including screening questions prior to the visit, additional usage of staff PPE, and extensive cleaning of exam room while observing appropriate contact time as indicated for disinfecting solutions.  Notice: This dictation was prepared with Dragon dictation along with smaller phrase technology. Any transcriptional errors that result from this process are unintentional and may not be corrected upon review.  Patient Instructions / Medication Changes & Studies & Tests Ordered   Patient Instructions  Medication Instructions:   No changes  *If you need a refill on your cardiac medications before your next appointment, please call your pharmacy*   Lab Work:  Not needed   Testing/Procedures:  In  5 months -Your physician has recommended that you wear a holter monitor Zio  3 days  Prior to next appointment. Holter monitors are medical devices that record the heart's electrical  activity. Doctors most often use these monitors to diagnose arrhythmias. Arrhythmias are problems with the speed or rhythm of the heartbeat. The monitor is a small, portable device. You can wear one while you do your normal daily activities. This is usually used to diagnose what is causing palpitations/syncope (passing out).    Follow-Up: At [facility], you and your health needs are our priority.  As part of our continuing mission to provide you with exceptional heart care, we have created designated Provider Care Teams.  These Care Teams include your primary Cardiologist (physician) and Advanced Practice Providers (APPs -  Physician Assistants and Nurse Practitioners) who all work together to provide you with the care you need, when you need it.     Your next appointment:   6 month(s)  The format for your next appointment:   In Person  Provider:   Bryan Lemma, MD   Other Instructions     Studies Ordered:   Orders Placed This Encounter  Procedures  . LONG TERM MONITOR (3-14 DAYS)     Bryan Lemma, M.D., M.S. Interventional Cardiologist   Pager # [phone] Phone # [phone] [address] [place], [place] [address]   Thank you for choosing Heartcare at [facility]!!

## 2020-06-08 NOTE — Patient Instructions (Addendum)
Medication Instructions:   No changes  *If you need a refill on your cardiac medications before your next appointment, please call your pharmacy*   Lab Work:  Not needed   Testing/Procedures:  In 5 months -Your physician has recommended that you wear a holter monitor Zio  3 days  Prior to next appointment. Holter monitors are medical devices that record the heart's electrical activity. Doctors most often use these monitors to diagnose arrhythmias. Arrhythmias are problems with the speed or rhythm of the heartbeat. The monitor is a small, portable device. You can wear one while you do your normal daily activities. This is usually used to diagnose what is causing palpitations/syncope (passing out).    Follow-Up: At [facility], you and your health needs are our priority.  As part of our continuing mission to provide you with exceptional heart care, we have created designated Provider Care Teams.  These Care Teams include your primary Cardiologist (physician) and Advanced Practice Providers (APPs -  Physician Assistants and Nurse Practitioners) who all work together to provide you with the care you need, when you need it.     Your next appointment:   6 month(s)  The format for your next appointment:   In Person  Provider:   Bryan Lemma, MD   Other Instructions

## 2020-06-09 ENCOUNTER — Ambulatory Visit: Payer: 59

## 2020-06-09 DIAGNOSIS — R030 Elevated blood-pressure reading, without diagnosis of hypertension: Secondary | ICD-10-CM

## 2020-06-09 DIAGNOSIS — I2510 Atherosclerotic heart disease of native coronary artery without angina pectoris: Secondary | ICD-10-CM

## 2020-06-09 DIAGNOSIS — I493 Ventricular premature depolarization: Secondary | ICD-10-CM

## 2020-06-17 ENCOUNTER — Ambulatory Visit: Payer: 59 | Admitting: General Practice

## 2020-06-24 ENCOUNTER — Encounter: Payer: Self-pay | Admitting: Cardiology

## 2020-06-24 NOTE — Assessment & Plan Note (Signed)
Coronary calcium score was 18, and cardiac catheterization was essentially normal.  Overall relatively mild risk.  Continue statin.  No true indication for aspirin.  She is on beta-blocker for PVCs.

## 2020-06-24 NOTE — Assessment & Plan Note (Signed)
Blood pressure is pretty well controlled and she is on the Brabham low-dose long-acting beta-blocker and tolerating it relatively well.  She had issues with fatigue using beta-blockers in the past, but seems to be doing okay.

## 2020-06-24 NOTE — Assessment & Plan Note (Signed)
Last check lipids were pretty well controlled with LDL 74 on 20 mg atorvastatin.  Well within target range based on her coronary calcium score and relatively normal coronary angiogram.

## 2020-06-24 NOTE — Assessment & Plan Note (Signed)
Pretty asymptomatic PVCs, but pretty high burden.  They seem to have stabilized a little bit on beta-blocker.  Did not increase with exertion.  Despite having abnormal GXT, cardiac catheterization showed normal coronary arteries and echocardiogram was essentially normal as well.  Therefore no structural abnormalities or ischemic etiology indicated.    Plan: Continue current dose of nadolol, recheck 3-day Zio patch monitor prior to follow-up to assess burden on beta-blocker.  If the burden still remains high, may consider referral to EP.

## 2020-06-30 NOTE — Telephone Encounter (Signed)
I will send message to our [facility] pharmacist for there response . Once it is available some one will send you a message back  [employer]

## 2020-09-04 ENCOUNTER — Other Ambulatory Visit: Payer: Self-pay | Admitting: Internal Medicine

## 2020-09-04 DIAGNOSIS — Z1231 Encounter for screening mammogram for malignant neoplasm of breast: Secondary | ICD-10-CM

## 2020-09-09 ENCOUNTER — Other Ambulatory Visit: Payer: Self-pay

## 2020-09-09 ENCOUNTER — Ambulatory Visit (HOSPITAL_BASED_OUTPATIENT_CLINIC_OR_DEPARTMENT_OTHER)
Admission: RE | Admit: 2020-09-09 | Discharge: 2020-09-09 | Disposition: A | Discharge status: Home / Self Care | Payer: 59 | Source: Ambulatory Visit | Attending: Internal Medicine | Admitting: Internal Medicine

## 2020-09-09 DIAGNOSIS — Z1231 Encounter for screening mammogram for malignant neoplasm of breast: Secondary | ICD-10-CM | POA: Diagnosis not present

## 2020-09-29 NOTE — Telephone Encounter (Signed)
A quite likely could be anemia some fatigue.  Unfortunately we cannot simply stop it.  Would recommend cutting the dose down to 1/2 tablet, and reassess.  Bryan Lemma, MD

## 2020-11-04 ENCOUNTER — Telehealth: Payer: Self-pay | Admitting: Cardiology

## 2020-11-04 ENCOUNTER — Ambulatory Visit (INDEPENDENT_AMBULATORY_CARE_PROVIDER_SITE_OTHER): Payer: 59

## 2020-11-04 DIAGNOSIS — I493 Ventricular premature depolarization: Secondary | ICD-10-CM | POA: Diagnosis not present

## 2020-11-04 NOTE — Telephone Encounter (Signed)
Attempted to call the patient. Line was busy

## 2020-11-04 NOTE — Telephone Encounter (Signed)
Patient called to say that the last time she was in office she was told that she would need to wear a heart monitor for a week. Patient states she hasnt heard anything and was calling to see what needs to be done to make that happen. Please advise

## 2020-11-17 ENCOUNTER — Other Ambulatory Visit: Payer: Self-pay | Admitting: *Deleted

## 2020-11-17 DIAGNOSIS — I493 Ventricular premature depolarization: Secondary | ICD-10-CM

## 2020-11-20 NOTE — Telephone Encounter (Signed)
Reviewed pt's chart and monitor was done see report under CV procedures ./cy

## 2020-12-22 ENCOUNTER — Other Ambulatory Visit: Payer: Self-pay | Admitting: Registered Nurse

## 2020-12-22 DIAGNOSIS — R911 Solitary pulmonary nodule: Secondary | ICD-10-CM

## 2020-12-22 NOTE — Telephone Encounter (Signed)
I am not sure why she does not have an appointment scheduled for 3-month follow-up.  It clearly was listed as that was a plan by my last note in April.  I agree, if the nadolol it does not seem to be doing the job, my recommendation would be at this point to cut the dose in half.  This way we can still keep some of the PVCs at bay.  We need to look into why she is not on my schedule for this month.  If nothing else, we could have her added as a virtual visit.  Depending on how she is doing, we can consider having her seen by EP, there is any other treatment for PVCs.  However with her having bradycardia, we really cannot do much more on beta-blocker standpoint.   Bryan Lemma, MD

## 2020-12-23 NOTE — Telephone Encounter (Signed)
Spoke to   the patient.  Apologize for patient not receiving a call  but informed patient  a letter was mailed to her .  Appointment was  schedule for 12/28/20  4 pm Patient verbalized understanding.

## 2020-12-28 ENCOUNTER — Ambulatory Visit: Payer: 59 | Admitting: Cardiology

## 2020-12-28 ENCOUNTER — Encounter: Payer: Self-pay | Admitting: Cardiology

## 2020-12-28 ENCOUNTER — Other Ambulatory Visit: Payer: Self-pay

## 2020-12-28 DIAGNOSIS — I493 Ventricular premature depolarization: Secondary | ICD-10-CM | POA: Diagnosis not present

## 2020-12-28 DIAGNOSIS — E785 Hyperlipidemia, unspecified: Secondary | ICD-10-CM | POA: Diagnosis not present

## 2020-12-28 DIAGNOSIS — I2510 Atherosclerotic heart disease of native coronary artery without angina pectoris: Secondary | ICD-10-CM

## 2020-12-28 NOTE — Progress Notes (Signed)
Primary Care Provider: Merri Brunette, MD Cardiologist: Bryan Lemma, MD Electrophysiologist: None  Clinic Note: Chief Complaint  Patient presents with   Follow-up    Discussed results of recent monitor   PVCs    Zio patch monitor result.  Not doing well on beta-blocker.    ===================================  ASSESSMENT/PLAN   Problem List Items Addressed This Visit       Cardiology Problems   Frequent unifocal PVCs (Chronic)    She did have a notable brief decrease in PVCs with a beta-blocker, but she is noticing a lot of fatigue with this beta-blocker.  We talked about several different options including switching to a different beta-blocker (I would probably choose acebutolol since she has not done well with metoprolol and nadolol).  For now, the plan will be to back down on her beta-blocker dose.  I want to try to get her down to 20 mg a day.  We may need to change the prescription.  She will try to cut down to half of her current tablet which would be 40 mg and potentially even further after that.  We will see her back about 6 weeks to see how she feels.  Depending on how she is doing then, we can decide whether or not we ask EP assistance for medical management versus simply changing to different beta-blocker.      Coronary artery calcification seen on CAT scan (Chronic)    Patient.Brock coronary calcium score with normal coronary arteries on  Target LDL less than 100.  He is on atorvastatin.  LDL 74. On beta-blocker for PVCs, if tolerable, would continue.      Hyperlipidemia with target LDL less than 100 (Chronic)    As noted.  Lipids well controlled.  Continue current dose of atorvastatin.     ===================================  HPI:    Katherine Brock is a 55 y.o. female former smoker (currently using JUUL E- cigarettes) with history of frequent PVCs, (~16 % by Monitor), Abnormal GXT but Normal Coronaries on Cath along with anxiety and depression who presents  today persistent PVCs.  Initial consult for coronary calcification seen on CT scan in 2018-> also noted frequent PVCs. Nonischemic Treadmill Stress Echo December 2018 Reconsulted in March 2022 for PVCs and coronary calcification->  Zio patch monitor showed 16 % PVCs.  Coronary calcium score is 18 ;  05/05/2020 : relatively asymptomatic PVCs-=> GXT and 2D echo. 05/22/2020 seen by Edd Fabian, NP for abnormal treadmill stress test-highly positive, referred for cardiac catheterization.  Echocardiogram was normal. Cardiac cath 05/29/2020 -> angiographically normal coronary arteries normal EF and normal EDP.  Katherine Brock was last seen by me on June 08, 2020 for post cath-echo follow-up.  We decided to start Brock-dose beta-blocker for PVCs.  She ended up on nadolol.  ->  Repeat monitor ordered in September to reassess PVC burden.  Recent Hospitalizations:  None  Reviewed  CV studies:    The following studies were reviewed today: (if available, images/films reviewed: From Epic Chart or Care Everywhere) Event Monitor:  Primarily SR: Min HR 42 bpm, maximum 100 bpm. Average 62 bpm.  Frequent PVCs (7.2%). Ventricular bigeminy and trigeminy noted.  Rare PACs  No arrhythmias: Atrial fibrillation, atrial flutter, SVT, VT.  Interval History:   Katherine Brock presents here today to discuss her concerns about needing to be on a beta-blocker, PVCs etc.  She still has not really noticed the PVCs.  She does not notice that she does not have  a lot of energy with the nadolol.  She feels tired and groggy all the time.  The plan was for her to follow-up after her monitor, and this has not happened so this visit was scheduled.  The intention was explained to her that her PVC burden has dropped by about a half on the beta-blocker.  She continues to be asymptomatic although in questions the reasoning behind being on a beta-blocker.  She denies any lightheadedness dizziness or wooziness but does note fatigue  and lack of exercise tolerance on beta-blocker.  Baseline bradycardia.  CV Review of Symptoms (Summary): positive for - Exercise intolerance, more because of deconditioning, and being on the beta-blocker.  More fatigued and dyspnea... negative for - chest pain, dyspnea on exertion, edema, irregular heartbeat, orthopnea, palpitations, paroxysmal nocturnal dyspnea, rapid heart rate, shortness of breath, or Lightheadedness or dizziness or wooziness, syncope/near syncope or TIA/amaurosis fugax, claudication.  The patient does not have symptoms concerning for COVID-19 infection (fever, chills, cough, or new shortness of breath).   REVIEWED OF SYSTEMS   Review of Systems  Constitutional:  Positive for malaise/fatigue (Has been tired since starting beta-blocker.). Negative for weight loss.  HENT:  Negative for congestion and nosebleeds.   Respiratory:  Positive for cough (Still has a mild smoker's cough), shortness of breath (With exertion) and wheezing (With allergies more than anything else now.).   Cardiovascular:  Negative for leg swelling.  Gastrointestinal:  Negative for abdominal pain, blood in stool and melena.  Genitourinary:  Negative for hematuria.  Musculoskeletal:  Negative for falls and joint pain.  Neurological:  Negative for dizziness, focal weakness, weakness and headaches.  Psychiatric/Behavioral:  Negative for depression (Well-controlled) and memory loss. The patient does not have insomnia. Nervous/anxious: Pretty well controlled.  I have reviewed and (if needed) personally updated the patient's problem list, medications, allergies, past medical and surgical history, social and family history.   PAST MEDICAL HISTORY   Past Medical History:  Diagnosis Date   Agatston coronary artery calcium score less than 100 03/2020   Coronary calcium score 18   Anxiety    Chronic back pain    hx of childhood scoliosis, has stabilization rods in back   Depression    Frequent PVCs 2018    Evaluated with Stress Echo -> nonischemic; 16.4 %: January 2022   Kidney stones    Tobacco dependence     PAST SURGICAL HISTORY   Past Surgical History:  Procedure Laterality Date   CESAREAN SECTION  2001   CHOLECYSTECTOMY  2009   GRADUATED TREADMILL EXERCISE TOLERANCE TEST  05/15/2020    Exercised 8:35 min. Peak HR 164 = 99% MPHR (reached 85% @ 6 min). 10.1 METS. HTN response. Horizontal-downsloping ST depressions ~ 3 mm in II, III, aVF, V5&V6. = ++ TM GXT. => FALSE POSITIVE-NORMAL HEART CATH   LEFT HEART CATH AND CORONARY ANGIOGRAPHY N/A 05/29/2020   Procedure: LEFT HEART CATH AND CORONARY ANGIOGRAPHY;  Surgeon: Marykay Lex, MD;  Location: [facility] INVASIVE CV LAB;  Service: Cardiovascular;  Laterality: N/A;;; Normal EF.  Normal EDP.  Angiographically normal coronary arteries.  Tortuous and relatively small caliber.   OOPHORECTOMY     scoliosis stabilization  1979   SPINAL FUSION     TOTAL ABDOMINAL HYSTERECTOMY W/ BILATERAL SALPINGOOPHORECTOMY  2007   TRANSTHORACIC ECHOCARDIOGRAM  05/29/2020   EF 60-65%. No RWMA. Normal Diastolic pressures. Normal RV.  Normal atrial sizes.  Normal valves.   TREADMILL STRESS ECHOCARDIOGRAM  02/20/2017   Exercised 9:30 min -  10.9 METS.  83% MPHR (139 BPM) No CP. Frequent PVCs noted both at rest and stress with occasional ventricular bigeminy.  No VT.  EF baseline 55% improved to 70% with no regional wall motion normalities.-Nonischemic.  Excellent exercise tolerance.     Immunization History  Administered Date(s) Administered   Influenza Whole 12/05/2009    MEDICATIONS/ALLERGIES   Current Meds  Medication Sig   ALPRAZolam (XANAX) 0.25 MG tablet Take 0.25 mg by mouth at bedtime as needed for anxiety or sleep.   ascorbic acid (VITAMIN C) 500 MG tablet Take 500 mg by mouth in the morning.   atorvastatin (LIPITOR) 20 MG tablet Take 20 mg by mouth daily at 12 noon.   buPROPion (WELLBUTRIN XL) 150 MG 24 hr tablet Take 150 mg by mouth daily at 12 noon.    cholecalciferol (VITAMIN D3) 25 MCG (1000 UNIT) tablet Take 1,000 Units by mouth in the morning.   ibuprofen (ADVIL) 200 MG tablet Take 400 mg by mouth every 8 (eight) hours as needed (pain).   Multiple Vitamin (MULTIVITAMIN WITH MINERALS) TABS tablet Take 1 tablet by mouth in the morning. One-A-Day for Women   nadolol (CORGARD) 80 MG tablet Take 1 tablet (80 mg total) by mouth daily. (Patient taking differently: Take 80 mg by mouth at bedtime.)   Polyethyl Glycol-Propyl Glycol (SYSTANE) 0.4-0.3 % SOLN Place 1-2 drops into both eyes 3 (three) times daily as needed (dry/irritated eyes).   SENNOSIDES PO Take 25.5 mg by mouth at bedtime. Swiss Kriss Herbal Laxative Tablets   sertraline (ZOLOFT) 100 MG tablet Take 100 mg by mouth daily at 12 noon.    No Known Allergies  SOCIAL HISTORY/FAMILY HISTORY   Reviewed in Epic:  Pertinent findings: no change in Family History Social History   Tobacco Use   Smoking status: Former    Packs/day: 0.25    Years: 20.00    Pack years: 5.00    Types: Cigarettes    Quit date: 11/24/2016    Years since quitting: 4.1   Smokeless tobacco: Never   Tobacco comments:    Currently using Juul cigarettes  Vaping Use   Vaping Use: Every day   Start date: 03/23/2016   Devices: JUUL  Substance Use Topics   Alcohol use: Yes   Drug use: No   Social History   Social History Narrative   Walks 1-2 times per day most days of the week.      Currently working from home with the same company.      Now that she is divorced, she is a single mother.  (Her daughter is adopted at age 51).  Son is alive and well at 66 years old.    OBJCTIVE -PE, EKG, labs   Wt Readings from Last 3 Encounters:  12/28/20 178 lb 3.2 oz (80.8 kg)  06/08/20 169 lb (76.7 kg)  05/29/20 170 lb (77.1 kg)    Physical Exam: BP 100/74   Pulse (!) 54   Ht 5\' 7"  (1.702 m)   Wt 178 lb 3.2 oz (80.8 kg)   SpO2 99%   BMI 27.91 kg/m  Physical Exam Vitals reviewed.  Constitutional:       General: She is not in acute distress.    Appearance: Normal appearance. She is normal weight. She is not ill-appearing or toxic-appearing.     Comments: Well-nourished, well-groomed.  Healthy-appearing.  HENT:     Head: Normocephalic and atraumatic.  Neck:     Vascular: No carotid bruit or JVD.  Cardiovascular:     Rate and Rhythm: Regular rhythm. Bradycardia present. Occasional Extrasystoles are present.    Chest Wall: PMI is not displaced.     Pulses: Normal pulses and intact distal pulses.     Heart sounds: Normal heart sounds, S1 normal and S2 normal. No murmur heard.   No friction rub. No gallop.  Pulmonary:     Effort: Pulmonary effort is normal. No respiratory distress.     Breath sounds: Normal breath sounds.  Chest:     Chest wall: No tenderness.  Musculoskeletal:        General: No swelling. Normal range of motion.     Cervical back: Normal range of motion and neck supple.  Skin:    General: Skin is warm and dry.  Neurological:     General: No focal deficit present.     Mental Status: She is alert and oriented to person, place, and time. Mental status is at baseline.     Motor: No weakness.     Gait: Gait normal.  Psychiatric:        Mood and Affect: Mood normal.        Behavior: Behavior normal.        Thought Content: Thought content normal.        Judgment: Judgment normal.    Adult ECG Report Not checked  Recent Labs:  12/17/2020: TC 181, TG 56, HDL 84, LDL 74 12/13/2019 : TC 170, TG 53, HDL 85, LDL 74  Lab Results  Component Value Date   CREATININE 0.79 05/22/2020   BUN 14 05/22/2020   NA 142 05/22/2020   K 4.6 05/22/2020   CL 106 05/22/2020   CO2 21 05/22/2020   CBC Latest Ref Rng & Units 05/22/2020 06/26/2013 01/30/2008  WBC 3.4 - 10.8 x10E3/uL 6.8 5.8 8.9  Hemoglobin 11.1 - 15.9 g/dL 62.3 76.2 8.7 DELTA CHECK NOTED(L)  Hematocrit 34.0 - 46.6 % 38.4 44.5 25.6(L)  Platelets 150 - 450 x10E3/uL 254 215 222    No results found for:  TSH  ==================================================  COVID-19 Education: The signs and symptoms of COVID-19 were discussed with the patient and how to seek care for testing (follow up with PCP or arrange E-visit).   The importance of social distancing and COVID-19 vaccination was discussed today. The patient is practicing social distancing & Masking.   I spent a total of 27 minutes with the patient spent in direct patient consultation.  Additional time spent with chart review  / charting (studies, outside notes, etc): 17 min Total Time: 44 min  Current medicines are reviewed at length with the patient today.  (+/- concerns) N/A  This visit occurred during the SARS-CoV-2 public health emergency.  Safety protocols were in place, including screening questions prior to the visit, additional usage of staff PPE, and extensive cleaning of exam room while observing appropriate contact time as indicated for disinfecting solutions.  Notice: This dictation was prepared with Dragon dictation along with smaller phrase technology. Any transcriptional errors that result from this process are unintentional and may not be corrected upon review.  Patient Instructions / Medication Changes & Studies & Tests Ordered   Patient Instructions  Medication Instructions:  Decrease to 1/2 tablet of Nadolol  ( 40  Mg) daily   *If you need a refill on your cardiac medications before your next appointment, please call your pharmacy*   Lab Work: Not needed    Testing/Procedures: Not needed   Follow-Up: At [facility], you and  your health needs are our priority.  As part of our continuing mission to provide you with exceptional heart care, we have created designated Provider Care Teams.  These Care Teams include your primary Cardiologist (physician) and Advanced Practice Providers (APPs -  Physician Assistants and Nurse Practitioners) who all work together to provide you with the care you need, when you  need it.     Your next appointment:   6 week(s)  The format for your next appointment:   In Person  Provider:   Bryan Lemma, MD     Studies Ordered:   No orders of the defined types were placed in this encounter.    Bryan Lemma, M.D., M.S. Interventional Cardiologist   Pager # [phone] Phone # [phone] [address] [place], [place] [address]   Thank you for choosing Heartcare at [facility]!!

## 2020-12-28 NOTE — Patient Instructions (Addendum)
Medication Instructions:  Decrease to 1/2 tablet of Nadolol  ( 40  Mg) daily   *If you need a refill on your cardiac medications before your next appointment, please call your pharmacy*   Lab Work: Not needed    Testing/Procedures: Not needed   Follow-Up: At [facility], you and your health needs are our priority.  As part of our continuing mission to provide you with exceptional heart care, we have created designated Provider Care Teams.  These Care Teams include your primary Cardiologist (physician) and Advanced Practice Providers (APPs -  Physician Assistants and Nurse Practitioners) who all work together to provide you with the care you need, when you need it.     Your next appointment:   6 week(s)  The format for your next appointment:   In Person  Provider:   Bryan Lemma, MD

## 2020-12-30 ENCOUNTER — Encounter: Payer: Self-pay | Admitting: Cardiology

## 2020-12-30 NOTE — Assessment & Plan Note (Signed)
As noted.  Lipids well controlled.  Continue current dose of atorvastatin.

## 2020-12-30 NOTE — Assessment & Plan Note (Signed)
She did have a notable brief decrease in PVCs with a beta-blocker, but she is noticing a lot of fatigue with this beta-blocker.  We talked about several different options including switching to a different beta-blocker (I would probably choose acebutolol since she has not done well with metoprolol and nadolol).  For now, the plan will be to back down on her beta-blocker dose.  I want to try to get her down to 20 mg a day.  We may need to change the prescription.  She will try to cut down to half of her current tablet which would be 40 mg and potentially even further after that.  We will see her back about 6 weeks to see how she feels.  Depending on how she is doing then, we can decide whether or not we ask EP assistance for medical management versus simply changing to different beta-blocker.

## 2020-12-30 NOTE — Assessment & Plan Note (Signed)
Patient.Low coronary calcium score with normal coronary arteries on  Target LDL less than 100.  He is on atorvastatin.  LDL 74. On beta-blocker for PVCs, if tolerable, would continue.

## 2021-01-05 NOTE — Addendum Note (Signed)
Addended by: Daryll Brod on: 01/05/2021 10:59 AM   Modules accepted: Orders

## 2021-01-11 ENCOUNTER — Other Ambulatory Visit: Payer: 59

## 2021-01-21 ENCOUNTER — Ambulatory Visit
Admission: RE | Admit: 2021-01-21 | Discharge: 2021-01-21 | Disposition: A | Discharge status: Home / Self Care | Payer: 59 | Source: Ambulatory Visit | Attending: Registered Nurse | Admitting: Registered Nurse

## 2021-01-21 ENCOUNTER — Other Ambulatory Visit: Payer: Self-pay

## 2021-01-21 ENCOUNTER — Other Ambulatory Visit: Payer: Self-pay | Admitting: Registered Nurse

## 2021-01-21 DIAGNOSIS — F17200 Nicotine dependence, unspecified, uncomplicated: Secondary | ICD-10-CM

## 2021-01-21 DIAGNOSIS — R911 Solitary pulmonary nodule: Secondary | ICD-10-CM

## 2021-02-04 DIAGNOSIS — R5383 Other fatigue: Secondary | ICD-10-CM | POA: Insufficient documentation

## 2021-02-04 NOTE — Progress Notes (Signed)
Virtual Visit via Telephone Note   This visit type was conducted due to national recommendations for restrictions regarding the COVID-19 Pandemic (e.g. social distancing) in an effort to limit this patient's exposure and mitigate transmission in our community.  Due to her co-morbid illnesses, this patient is at least at moderate risk for complications without adequate follow up.  This format is felt to be most appropriate for this patient at this time.  The patient did not have access to video technology/had technical difficulties with video requiring transitioning to audio format only (telephone).  All issues noted in this document were discussed and addressed.  No physical exam could be performed with this format.  Please refer to the patient's chart for her  consent to telehealth for [facility].   Patient has given verbal permission to conduct this visit via virtual appointment and to bill insurance 02/13/2021 8:21 PM     Evaluation Performed:  Follow-up visit  Date:  02/13/2021   ID:  Katherine Brock, DOB 04-17-1965, MRN [id number]  Patient Location: Home Provider Location: Home Office  PCP:  Deland Pretty, MD  Cardiologist:  Glenetta Hew, MD  Electrophysiologist:  None   Chief Complaint:   Chief Complaint  Patient presents with   Follow-up    Reevaluate symptoms after changing medication dosing..  Overall improved fatigue.     ====================================  ASSESSMENT & PLAN:    Problem List Items Addressed This Visit       Cardiology Problems   Frequent unifocal PVCs (Chronic)    Essentially asymptomatic PVCs.  Nonischemic-normal coronary angiography.  Notably reduced PVC burden with addition of beta-blocker.  However she was definitely symptomatic with fatigue and lethargy with a higher dose.  We will reduce the dose to 1/2 tablet and she is tolerating this much better.  Given the lack of overall symptoms that she had the PVCs, would not want to be more  aggressive than that.  Since she is doing well and starting to pick up her exercise level, we will continue to monitor.      Relevant Medications   nadolol (CORGARD) 80 MG tablet   Coronary artery calcification seen on CAT scan - Primary (Chronic)    Continue to target LDL of 100, and she is actually almost closer to 70 on current dose of statin.   Already on beta-blockers.   Continue to monitor blood pressure and glycemic control. Encourage increased exercise.      Relevant Medications   nadolol (CORGARD) 80 MG tablet   Hyperlipidemia with target LDL less than 100 (Chronic)    Lipid seem to be well-controlled with current dose of statin.      Relevant Medications   nadolol (CORGARD) 80 MG tablet     Other   Elevated blood-pressure reading without diagnosis of hypertension (Chronic)    BP is well controlled.  She is only on low-dose nadolol      Fatigue due to treatment (Chronic)    Definitely improved with reducing beta-blocker dose.  For now continue current dose of 40 mg daily.       ====================================  History of Present Illness:    Katherine Brock is a 55 y.o. female former smoker with PMH notable for frequent PVCs (on initial monitor 16 %-reduced to 7.2% on beta-blocker), Abnormal GXT with Normal Coronaries on Cath who presents via telephone conferencing for a telehealth visit today as a ~6 wk follow.[name] was last seen on December 28, 2020  to discuss treatment of her frequent PVCs and the need for beta-blocker.  She is feeling very tired and lethargic.  She also notes that she really does not feel the PVCs.  Nadolol seem to be making her feel tired and groggy. => We cut the dose of nadolol in half.  Plan was to reassess in 6 weeks to determine if we potentially would just switch to a different beta-blocker such as acebutolol.  Hospitalizations:  none   Recent - Interim CV studies:   The following studies were reviewed  today: none:  Inerval History   [name] presents via telehealth to discuss symptoms on reduced dose of beta-blocker. She says that overall her energy level has definitely improved since she backed off a dose of nadolol.  She certainly feels better, and continues to deny any sensation of significant palpitations or irregular heartbeats.  No longer feeling tired and groggy.  She is try to get back into doing some exercise now.  Had a lot of up-and-down stress issues with Christmas coming up, but denies any tachycardic symptoms.  Exercise tolerance is notably improved.  Cardiovascular ROS: positive for - -still feels little bit tired, but notably improved with reduced dose of beta-blocker.  She seems to be handling the current dose much better. negative for - chest pain, dyspnea on exertion, edema, irregular heartbeat, orthopnea, palpitations, paroxysmal nocturnal dyspnea, rapid heart rate, shortness of breath, or syncope/near syncope, foggy headedness, fatigue, TIA/amaurosis fugax, claudication.   ROS:  Please see the history of present illness.     Review of Systems  Constitutional:  Negative for malaise/fatigue (Notably improved).  Neurological:  Negative for dizziness and weakness.  Psychiatric/Behavioral:  Negative for depression and memory loss. The patient is not nervous/anxious.   All other systems reviewed and are negative.  Past Medical History:  Diagnosis Date   Agatston coronary artery calcium score less than 100 03/2020   Coronary calcium score 18   Anxiety    Chronic back pain    hx of childhood scoliosis, has stabilization rods in back   Depression    Frequent PVCs 2018   Evaluated with Stress Echo -> nonischemic; 16.4 %: January 2022   Kidney stones    Tobacco dependence    Past Surgical History:  Procedure Laterality Date   CESAREAN SECTION  2001   CHOLECYSTECTOMY  2009   GRADUATED TREADMILL EXERCISE TOLERANCE TEST  05/15/2020    Exercised 8:35 min. Peak  HR 164 = 99% MPHR (reached 85% @ 6 min). 10.1 METS. HTN response. Horizontal-downsloping ST depressions ~ 3 mm in II, III, aVF, V5&V6. = ++ TM GXT. => FALSE POSITIVE-NORMAL HEART CATH   LEFT HEART CATH AND CORONARY ANGIOGRAPHY N/A 05/29/2020   Procedure: LEFT HEART CATH AND CORONARY ANGIOGRAPHY;  Surgeon: Marykay Lex, MD;  Location: [facility] INVASIVE CV LAB;  Service: Cardiovascular;  Laterality: N/A;;; Normal EF.  Normal EDP.  Angiographically normal coronary arteries.  Tortuous and relatively small caliber.   OOPHORECTOMY     scoliosis stabilization  1979   SPINAL FUSION     TOTAL ABDOMINAL HYSTERECTOMY W/ BILATERAL SALPINGOOPHORECTOMY  2007   TRANSTHORACIC ECHOCARDIOGRAM  05/29/2020   EF 60-65%. No RWMA. Normal Diastolic pressures. Normal RV.  Normal atrial sizes.  Normal valves.   TREADMILL STRESS ECHOCARDIOGRAM  02/20/2017   Exercised 9:30 min - 10.9 METS.  83% MPHR (139 BPM) No CP. Frequent PVCs noted both at rest and stress with occasional ventricular bigeminy.  No VT.  EF  baseline 55% improved to 70% with no regional wall motion normalities.-Nonischemic.  Excellent exercise tolerance.      Current Meds  Medication Sig   ALPRAZolam (XANAX) 0.25 MG tablet Take 0.25 mg by mouth at bedtime as needed for anxiety or sleep.   ascorbic acid (VITAMIN C) 500 MG tablet Take 500 mg by mouth in the morning.   atorvastatin (LIPITOR) 20 MG tablet Take 20 mg by mouth every other day.   buPROPion (WELLBUTRIN XL) 150 MG 24 hr tablet Take 150 mg by mouth daily at 12 noon.   Cholecalciferol (VITAMIN D3) 250 MCG (10000 UT) TABS Take 1 tablet by mouth daily.   ibuprofen (ADVIL) 200 MG tablet Take 400 mg by mouth every 8 (eight) hours as needed (pain).   Multiple Vitamin (MULTIVITAMIN WITH MINERALS) TABS tablet Take 1 tablet by mouth in the morning. One-A-Day for Women   nadolol (CORGARD) 80 MG tablet Take 40 mg by mouth daily.   Polyethyl Glycol-Propyl Glycol (SYSTANE) 0.4-0.3 % SOLN Place 1-2 drops into both  eyes 3 (three) times daily as needed (dry/irritated eyes).   SENNOSIDES PO Take 25.5 mg by mouth daily as needed. [employer] Kriss Herbal Laxative Tablets   sertraline (ZOLOFT) 100 MG tablet Take 100 mg by mouth daily at 12 noon.     Allergies:   Patient has no known allergies.   Social History   Tobacco Use   Smoking status: Former    Packs/day: 0.25    Years: 20.00    Pack years: 5.00    Types: Cigarettes    Quit date: 11/24/2016    Years since quitting: 4.2   Smokeless tobacco: Never   Tobacco comments:    Currently using Juul cigarettes  Vaping Use   Vaping Use: Every day   Start date: 03/23/2016   Devices: JUUL  Substance Use Topics   Alcohol use: Yes   Drug use: No     Family Hx: The patient's family history includes Breast cancer in her paternal grandmother; Berenice Primas' disease in her mother; Hypertension in her brother, father, and mother; Polymyalgia rheumatica in her father.   Labs/Other Tests and Data Reviewed:    EKG:  No ECG reviewed.  Recent Labs: 05/22/2020: BUN 14; Creatinine, Ser 0.79; Hemoglobin 12.6; Platelets 254; Potassium 4.6; Sodium 142   Recent Lipid Panel No results found for: CHOL, TRIG, HDL, CHOLHDL, LDLCALC, LDLDIRECT  Wt Readings from Last 3 Encounters:  02/05/21 172 lb (78 kg)  12/28/20 178 lb 3.2 oz (80.8 kg)  06/08/20 169 lb (76.7 kg)     Objective:    Vital Signs:  BP 127/67   Pulse (!) 59   Ht 5\' 7"  (1.702 m)   Wt 172 lb (78 kg)   BMI 26.94 kg/m   VITAL SIGNS:  reviewed GEN:  no acute distress RESPIRATORY:  -Nonlabored NEURO:  A&Ox3 PSYCH:  -Normal mood and affect   ==========================================  COVID-19 Education: The signs and symptoms of COVID-19 were discussed with the patient and how to seek care for testing (follow up with PCP or arrange E-visit).   The importance of social distancing was discussed today.  Time:   Today, I have spent 9 minutes with the patient with telehealth technology discussing the  above problems.   An additional 8 minutes spent charting (reviewing prior notes, hospital records, studies, labs etc.) Total 106minutes   Medication Adjustments/Labs and Tests Ordered: Current medicines are reviewed at length with the patient today.  Concerns regarding medicines are outlined above.  Patient Instructions  Medication Instructions:  -- stay on 1/2 tab Nadolol -- (~40 mg total) daily  *If you need a refill on your cardiac medications before your next appointment, please call your pharmacy*   Lab Work: N/a     Testing/Procedures: N/a   Follow-Up: At [facility], you and your health needs are our priority.  As part of our continuing mission to provide you with exceptional heart care, we have created designated Provider Care Teams.  These Care Teams include your primary Cardiologist (physician) and Advanced Practice Providers (APPs -  Physician Assistants and Nurse Practitioners) who all work together to provide you with the care you need, when you need it.  We recommend signing up for the patient portal called "MyChart".  Sign up information is provided on this After Visit Summary.  MyChart is used to connect with patients for Virtual Visits (Telemedicine).  Patients are able to view lab/test results, encounter notes, upcoming appointments, etc.  Non-urgent messages can be sent to your provider as well.   To learn more about what you can do with MyChart, go to [email or web address].    Your next appointment:   10 month(s)  The format for your next appointment:   In Person  Provider:   Glenetta Hew, MD     Other Instructions Keep hydrated!!  HAPPY HOLIDAYS!!!   Signed, Glenetta Hew, MD  02/13/2021 8:21 PM    [place]

## 2021-02-05 ENCOUNTER — Telehealth (INDEPENDENT_AMBULATORY_CARE_PROVIDER_SITE_OTHER): Payer: 59 | Admitting: Cardiology

## 2021-02-05 ENCOUNTER — Encounter: Payer: Self-pay | Admitting: Cardiology

## 2021-02-05 ENCOUNTER — Other Ambulatory Visit: Payer: Self-pay

## 2021-02-05 ENCOUNTER — Telehealth: Payer: Self-pay | Admitting: *Deleted

## 2021-02-05 VITALS — BP 127/67 | HR 59 | Ht 67.0 in | Wt 172.0 lb

## 2021-02-05 DIAGNOSIS — E785 Hyperlipidemia, unspecified: Secondary | ICD-10-CM | POA: Diagnosis not present

## 2021-02-05 DIAGNOSIS — I493 Ventricular premature depolarization: Secondary | ICD-10-CM | POA: Diagnosis not present

## 2021-02-05 DIAGNOSIS — I2510 Atherosclerotic heart disease of native coronary artery without angina pectoris: Secondary | ICD-10-CM | POA: Diagnosis not present

## 2021-02-05 DIAGNOSIS — R030 Elevated blood-pressure reading, without diagnosis of hypertension: Secondary | ICD-10-CM | POA: Diagnosis not present

## 2021-02-05 DIAGNOSIS — R5383 Other fatigue: Secondary | ICD-10-CM

## 2021-02-05 NOTE — Telephone Encounter (Signed)
HAPPY HOLIDAYS TO YOU AS WELL !!  DH

## 2021-02-05 NOTE — Patient Instructions (Addendum)
Medication Instructions:  -- stay on 1/2 tab Nadolol -- (~40 mg total) daily  *If you need a refill on your cardiac medications before your next appointment, please call your pharmacy*   Lab Work: N/a     Testing/Procedures: N/a   Follow-Up: At [facility], you and your health needs are our priority.  As part of our continuing mission to provide you with exceptional heart care, we have created designated Provider Care Teams.  These Care Teams include your primary Cardiologist (physician) and Advanced Practice Providers (APPs -  Physician Assistants and Nurse Practitioners) who all work together to provide you with the care you need, when you need it.  We recommend signing up for the patient portal called "MyChart".  Sign up information is provided on this After Visit Summary.  MyChart is used to connect with patients for Virtual Visits (Telemedicine).  Patients are able to view lab/test results, encounter notes, upcoming appointments, etc.  Non-urgent messages can be sent to your provider as well.   To learn more about what you can do with MyChart, go to [email or web address].    Your next appointment:   10 month(s)  The format for your next appointment:   In Person  Provider:   Bryan Lemma, MD     Other Instructions Keep hydrated!!  HAPPY HOLIDAYS!!!

## 2021-02-05 NOTE — Telephone Encounter (Signed)
  Patient Consent for Virtual Visit        [name] has provided verbal consent on 02/05/2021 for a virtual visit (video or telephone).   CONSENT FOR VIRTUAL VISIT FOR:  Katherine Brock  By participating in this virtual visit I agree to the following:  I hereby voluntarily request, consent and authorize [facility] and its employed or contracted physicians, physician assistants, nurse practitioners or other licensed health care professionals (the Practitioner), to provide me with telemedicine health care services (the "Services") as deemed necessary by the treating Practitioner. I acknowledge and consent to receive the Services by the Practitioner via telemedicine. I understand that the telemedicine visit will involve communicating with the Practitioner through live audiovisual communication technology and the disclosure of certain medical information by electronic transmission. I acknowledge that I have been given the opportunity to request an in-person assessment or other available alternative prior to the telemedicine visit and am voluntarily participating in the telemedicine visit.  I understand that I have the right to withhold or withdraw my consent to the use of telemedicine in the course of my care at any time, without affecting my right to future care or treatment, and that the Practitioner or I may terminate the telemedicine visit at any time. I understand that I have the right to inspect all information obtained and/or recorded in the course of the telemedicine visit and may receive copies of available information for a reasonable fee.  I understand that some of the potential risks of receiving the Services via telemedicine include:  Delay or interruption in medical evaluation due to technological equipment failure or disruption; Information transmitted may not be sufficient (e.g. poor resolution of images) to allow for appropriate medical decision making by the Practitioner;  and/or  In rare instances, security protocols could fail, causing a breach of personal health information.  Furthermore, I acknowledge that it is my responsibility to provide information about my medical history, conditions and care that is complete and accurate to the best of my ability. I acknowledge that Practitioner's advice, recommendations, and/or decision may be based on factors not within their control, such as incomplete or inaccurate data provided by me or distortions of diagnostic images or specimens that may result from electronic transmissions. I understand that the practice of medicine is not an exact science and that Practitioner makes no warranties or guarantees regarding treatment outcomes. I acknowledge that a copy of this consent can be made available to me via my patient portal [facility] MyChart), or I can request a printed copy by calling the office of [facility].    I understand that my insurance will be billed for this visit.   I have read or had this consent read to me. I understand the contents of this consent, which adequately explains the benefits and risks of the Services being provided via telemedicine.  I have been provided ample opportunity to ask questions regarding this consent and the Services and have had my questions answered to my satisfaction. I give my informed consent for the services to be provided through the use of telemedicine in my medical care

## 2021-02-05 NOTE — Telephone Encounter (Signed)
FIRST ATTEMPT TO CONTACT PATIENT FOR VIRTUAL VISIT

## 2021-02-13 ENCOUNTER — Encounter: Payer: Self-pay | Admitting: Cardiology

## 2021-02-13 NOTE — Assessment & Plan Note (Signed)
Definitely improved with reducing beta-blocker dose.  For now continue current dose of 40 mg daily.

## 2021-02-13 NOTE — Assessment & Plan Note (Signed)
Lipid seem to be well-controlled with current dose of statin.

## 2021-02-13 NOTE — Assessment & Plan Note (Addendum)
BP is well controlled.  She is only on low-dose nadolol

## 2021-02-13 NOTE — Assessment & Plan Note (Signed)
Continue to target LDL of 100, and she is actually almost closer to 70 on current dose of statin.   Already on beta-blockers.   Continue to monitor blood pressure and glycemic control. Encourage increased exercise.

## 2021-02-13 NOTE — Assessment & Plan Note (Addendum)
Essentially asymptomatic PVCs.  Nonischemic-normal coronary angiography.  Notably reduced PVC burden with addition of beta-blocker.  However she was definitely symptomatic with fatigue and lethargy with a higher dose.  We will reduce the dose to 1/2 tablet and she is tolerating this much better.  Given the lack of overall symptoms that she had the PVCs, would not want to be more aggressive than that.  Since she is doing well and starting to pick up her exercise level, we will continue to monitor.

## 2021-06-30 ENCOUNTER — Other Ambulatory Visit: Payer: Self-pay | Admitting: Cardiology

## 2021-09-13 ENCOUNTER — Other Ambulatory Visit (HOSPITAL_BASED_OUTPATIENT_CLINIC_OR_DEPARTMENT_OTHER): Payer: Self-pay | Admitting: Internal Medicine

## 2021-09-13 ENCOUNTER — Encounter (HOSPITAL_BASED_OUTPATIENT_CLINIC_OR_DEPARTMENT_OTHER): Payer: 59 | Admitting: Radiology

## 2021-09-13 DIAGNOSIS — Z1231 Encounter for screening mammogram for malignant neoplasm of breast: Secondary | ICD-10-CM

## 2021-09-17 ENCOUNTER — Ambulatory Visit (HOSPITAL_BASED_OUTPATIENT_CLINIC_OR_DEPARTMENT_OTHER)
Admission: RE | Admit: 2021-09-17 | Discharge: 2021-09-17 | Disposition: A | Discharge status: Home / Self Care | Payer: 59 | Source: Ambulatory Visit | Attending: Internal Medicine | Admitting: Internal Medicine

## 2021-09-17 DIAGNOSIS — Z1231 Encounter for screening mammogram for malignant neoplasm of breast: Secondary | ICD-10-CM | POA: Diagnosis present

## 2021-09-20 ENCOUNTER — Other Ambulatory Visit: Payer: Self-pay | Admitting: Internal Medicine

## 2021-09-20 DIAGNOSIS — R928 Other abnormal and inconclusive findings on diagnostic imaging of breast: Secondary | ICD-10-CM

## 2021-09-25 ENCOUNTER — Ambulatory Visit: Payer: 59

## 2021-09-25 ENCOUNTER — Ambulatory Visit
Admission: RE | Admit: 2021-09-25 | Discharge: 2021-09-25 | Disposition: A | Discharge status: Home / Self Care | Payer: 59 | Source: Ambulatory Visit | Attending: Internal Medicine | Admitting: Internal Medicine

## 2021-09-25 DIAGNOSIS — R928 Other abnormal and inconclusive findings on diagnostic imaging of breast: Secondary | ICD-10-CM

## 2021-10-06 ENCOUNTER — Encounter: Payer: Self-pay | Admitting: Cardiology

## 2021-10-08 ENCOUNTER — Other Ambulatory Visit: Payer: 59

## 2021-12-29 ENCOUNTER — Other Ambulatory Visit: Payer: Self-pay | Admitting: Registered Nurse

## 2021-12-29 DIAGNOSIS — Z87891 Personal history of nicotine dependence: Secondary | ICD-10-CM

## 2022-01-17 ENCOUNTER — Ambulatory Visit: Payer: 59 | Attending: Nurse Practitioner | Admitting: Nurse Practitioner

## 2022-01-17 ENCOUNTER — Encounter: Payer: Self-pay | Admitting: Nurse Practitioner

## 2022-01-17 VITALS — BP 92/52 | HR 55 | Ht 67.0 in | Wt 142.0 lb

## 2022-01-17 DIAGNOSIS — R5383 Other fatigue: Secondary | ICD-10-CM

## 2022-01-17 DIAGNOSIS — I2510 Atherosclerotic heart disease of native coronary artery without angina pectoris: Secondary | ICD-10-CM | POA: Diagnosis not present

## 2022-01-17 DIAGNOSIS — I493 Ventricular premature depolarization: Secondary | ICD-10-CM

## 2022-01-17 DIAGNOSIS — E782 Mixed hyperlipidemia: Secondary | ICD-10-CM | POA: Diagnosis not present

## 2022-01-17 DIAGNOSIS — I10 Essential (primary) hypertension: Secondary | ICD-10-CM | POA: Diagnosis not present

## 2022-01-17 MED ORDER — NADOLOL 20 MG PO TABS: 20.0000 mg | ORAL_TABLET | Freq: Every day | ORAL | 3 refills | Status: DC

## 2022-01-17 NOTE — Progress Notes (Signed)
Office Visit    Patient Name: Katherine Brock Date of Encounter: 01/17/2022  Primary Care Provider:  Merri Brunette, MD Primary Cardiologist:  Bryan Lemma, MD  Chief Complaint    56 year old female with a history of coronary artery calcification on CT, frequent PVCs, hypertension, and hyperlipidemia who presents for follow-up related to hypertension and PVCs.  Past Medical History    Past Medical History:  Diagnosis Date   Agatston coronary artery calcium score less than 100 03/2020   Coronary calcium score 18   Anxiety    Chronic back pain    hx of childhood scoliosis, has stabilization rods in back   Depression    Frequent PVCs 2018   Evaluated with Stress Echo -> nonischemic; 16.4 %: January 2022   Kidney stones    Tobacco dependence    Past Surgical History:  Procedure Laterality Date   CESAREAN SECTION  2001   CHOLECYSTECTOMY  2009   GRADUATED TREADMILL EXERCISE TOLERANCE TEST  05/15/2020    Exercised 8:35 min. Peak HR 164 = 99% MPHR (reached 85% @ 6 min). 10.1 METS. HTN response. Horizontal-downsloping ST depressions ~ 3 mm in II, III, aVF, V5&V6. = ++ TM GXT. => FALSE POSITIVE-NORMAL HEART CATH   LEFT HEART CATH AND CORONARY ANGIOGRAPHY N/A 05/29/2020   Procedure: LEFT HEART CATH AND CORONARY ANGIOGRAPHY;  Surgeon: Marykay Lex, MD;  Location: [facility] INVASIVE CV LAB;  Service: Cardiovascular;  Laterality: N/A;;; Normal EF.  Normal EDP.  Angiographically normal coronary arteries.  Tortuous and relatively small caliber.   OOPHORECTOMY     scoliosis stabilization  1979   SPINAL FUSION     TOTAL ABDOMINAL HYSTERECTOMY W/ BILATERAL SALPINGOOPHORECTOMY  2007   TRANSTHORACIC ECHOCARDIOGRAM  05/29/2020   EF 60-65%. No RWMA. Normal Diastolic pressures. Normal RV.  Normal atrial sizes.  Normal valves.   TREADMILL STRESS ECHOCARDIOGRAM  02/20/2017   Exercised 9:30 min - 10.9 METS.  83% MPHR (139 BPM) No CP. Frequent PVCs noted both at rest and stress with occasional  ventricular bigeminy.  No VT.  EF baseline 55% improved to 70% with no regional wall motion normalities.-Nonischemic.  Excellent exercise tolerance.     Allergies  No Known Allergies  History of Present Illness    56 year old female with the above past medical history including coronary artery calcification on CT, frequent PVCs, hypertension, and hyperlipidemia.  Echo in 2018 was unremarkable.  Coronary calcium scoring in 2022 showed coronary calcium score of 18 (85th percentile).  Cardiac monitor in 04/2020 showed primarily sinus rhythm, PACs and frequent PVCs (16.4%).  TTE in 05/2018 was abnormal.  Follow-up cardiac catheterization in 05/2020 revealed no evidence of CAD, normal LVEDP, normal LV systolic function, EF 55 to 65%.  Echocardiogram at the time showed EF 60 to 65%, normal LV function, no RWMA, normal RV systolic function, no significant valvular abnormalities.  Repeat cardiac monitor in 11/2020 showed frequent PVC, significant arrhythmia.  PVC burden improved with beta-blocker therapy, however, she developed symptomatic fatigue with increased dosing.  She was last seen virtually on 02/05/2021 and was stable from a cardiac standpoint.  She reported improved energy levels with lower dose of beta-blocker.  Denies symptoms concerning for angina.  She presents today for follow-up.  Since her last visit has been stable from a cardiac standpoint.  She denies any palpitations, dizziness, chest pain, dyspnea..  She does note ongoing fatigue and is asking if her nadolol can be further decreased.  Otherwise, she reports feeling well.  Home  Medications    Current Outpatient Medications  Medication Sig Dispense Refill   ALPRAZolam (XANAX) 0.25 MG tablet Take 0.25 mg by mouth at bedtime as needed for anxiety or sleep.     ascorbic acid (VITAMIN C) 500 MG tablet Take 500 mg by mouth in the morning.     atorvastatin (LIPITOR) 20 MG tablet Take 20 mg by mouth every other day.     buPROPion (WELLBUTRIN  XL) 150 MG 24 hr tablet Take 150 mg by mouth daily at 12 noon.  1   Cholecalciferol (VITAMIN D3) 250 MCG (10000 UT) TABS Take 1 tablet by mouth daily.     diphenhydrAMINE (SOMINEX) 25 MG tablet Take by mouth.     Ferrous Sulfate (IRON) 325 (65 Fe) MG TABS 1 tablet Orally Once a day for 30 day(s)     ibuprofen (ADVIL) 200 MG tablet Take 400 mg by mouth every 8 (eight) hours as needed (pain).     Multiple Vitamin (MULTIVITAMIN WITH MINERALS) TABS tablet Take 1 tablet by mouth in the morning. One-A-Day for Women     nadolol (CORGARD) 20 MG tablet Take 1 tablet (20 mg total) by mouth daily. 90 tablet 3   Polyethyl Glycol-Propyl Glycol (SYSTANE) 0.4-0.3 % SOLN Place 1-2 drops into both eyes 3 (three) times daily as needed (dry/irritated eyes).     SENNOSIDES PO Take 25.5 mg by mouth daily as needed. Swiss Kriss Herbal Laxative Tablets     sertraline (ZOLOFT) 100 MG tablet Take 100 mg by mouth daily at 12 noon.     No current facility-administered medications for this visit.     Review of Systems    She denies chest pain, palpitations, dyspnea, pnd, orthopnea, n, v, dizziness, syncope, edema, weight gain, or early satiety. All other systems reviewed and are otherwise negative except as noted above.   Physical Exam    VS:  BP (!) 92/52 (BP Location: Left Arm, Patient Position: Sitting, Cuff Size: Normal)   Pulse (!) 55   Ht 5\' 7"  (1.702 m)   Wt 142 lb (64.4 kg)   SpO2 99%   BMI 22.24 kg/m  GEN: Well nourished, well developed, in no acute distress. HEENT: normal. Neck: Supple, no JVD, carotid bruits, or masses. Cardiac: RRR, no murmurs, rubs, or gallops. No clubbing, cyanosis, edema.  Radials/DP/PT 2+ and equal bilaterally.  Respiratory:  Respirations regular and unlabored, clear to auscultation bilaterally. GI: Soft, nontender, nondistended, BS + x 4. MS: no deformity or atrophy. Skin: warm and dry, no rash. Neuro:  Strength and sensation are intact. Psych: Normal affect.  Accessory  Clinical Findings    ECG personally reviewed by me today -sinus bradycardia, 55 bpm- no acute changes.   Lab Results  Component Value Date   WBC 6.8 05/22/2020   HGB 12.6 05/22/2020   HCT 38.4 05/22/2020   MCV 91 05/22/2020   PLT 254 05/22/2020   Lab Results  Component Value Date   CREATININE 0.79 05/22/2020   BUN 14 05/22/2020   NA 142 05/22/2020   K 4.6 05/22/2020   CL 106 05/22/2020   CO2 21 05/22/2020   Lab Results  Component Value Date   ALT 17 06/26/2013   AST 23 06/26/2013   ALKPHOS 66 06/26/2013   BILITOT 0.3 06/26/2013   No results found for: "CHOL", "HDL", "LDLCALC", "LDLDIRECT", "TRIG", "CHOLHDL"  No results found for: "HGBA1C"  Assessment & Plan    1. Frequent PVCs: Noted on prior monitors.  She denies any recent  palpitations.  Does note some ongoing fatigue with beta-blocker therapy and is asking to further decrease her nadolol dose.  Will decrease to 20 mg daily.  Continue to monitor symptoms.  2. Coronary artery calcification on CT: Coronary calcium scoring in 2022 showed coronary calcium score of 18 (85th percentile).  Follow-up cardiac catheterization in 05/2020 revealed no evidence of CAD, normal LVEDP, normal LV systolic function, EF 55 to 65%.  3. Hypertension: BP well controlled. Continue current antihypertensive regimen.   4. Hyperlipidemia: No recent LDL on file. Monitored and managed per PCP.  Continue Lipitor every other day.  5. Fatigue: She does note some ongoing fatigue with beta-blocker therapy.  Will decrease nadolol as above.  Continue to monitor.  6. Disposition: Follow-up in 1 year.     Joylene Grapes, NP 01/17/2022, 2:23 PM

## 2022-01-17 NOTE — Patient Instructions (Signed)
Medication Instructions:  Decrease Nadolol 20 mg daily  *If you need a refill on your cardiac medications before your next appointment, please call your pharmacy*   Lab Work: NONE ordered at this time of appointment   If you have labs (blood work) drawn today and your tests are completely normal, you will receive your results only by: MyChart Message (if you have MyChart) OR A paper copy in the mail If you have any lab test that is abnormal or we need to change your treatment, we will call you to review the results.   Testing/Procedures: NONE ordered at this time of appointment     Follow-Up: At [facility], you and your health needs are our priority.  As part of our continuing mission to provide you with exceptional heart care, we have created designated Provider Care Teams.  These Care Teams include your primary Cardiologist (physician) and Advanced Practice Providers (APPs -  Physician Assistants and Nurse Practitioners) who all work together to provide you with the care you need, when you need it.  We recommend signing up for the patient portal called "MyChart".  Sign up information is provided on this After Visit Summary.  MyChart is used to connect with patients for Virtual Visits (Telemedicine).  Patients are able to view lab/test results, encounter notes, upcoming appointments, etc.  Non-urgent messages can be sent to your provider as well.   To learn more about what you can do with MyChart, go to [email or web address].    Your next appointment:   1 year(s)  The format for your next appointment:   In Person  Provider:   Bryan Lemma, MD     Other Instructions   Important Information About Sugar

## 2022-01-31 ENCOUNTER — Encounter: Payer: Self-pay | Admitting: Registered Nurse

## 2022-02-02 ENCOUNTER — Ambulatory Visit
Admission: RE | Admit: 2022-02-02 | Discharge: 2022-02-02 | Disposition: A | Discharge status: Home / Self Care | Payer: 59 | Source: Ambulatory Visit | Attending: Registered Nurse | Admitting: Registered Nurse

## 2022-02-02 DIAGNOSIS — Z87891 Personal history of nicotine dependence: Secondary | ICD-10-CM

## 2022-02-17 ENCOUNTER — Ambulatory Visit: Payer: 59 | Admitting: Pulmonary Disease

## 2022-02-17 ENCOUNTER — Encounter: Payer: Self-pay | Admitting: Pulmonary Disease

## 2022-02-17 VITALS — BP 110/80 | HR 56 | Ht 67.0 in | Wt 140.6 lb

## 2022-02-17 DIAGNOSIS — R918 Other nonspecific abnormal finding of lung field: Secondary | ICD-10-CM

## 2022-02-17 DIAGNOSIS — Z7289 Other problems related to lifestyle: Secondary | ICD-10-CM | POA: Diagnosis not present

## 2022-02-17 DIAGNOSIS — R911 Solitary pulmonary nodule: Secondary | ICD-10-CM

## 2022-02-17 DIAGNOSIS — Z87891 Personal history of nicotine dependence: Secondary | ICD-10-CM

## 2022-02-17 NOTE — Patient Instructions (Signed)
Thank you for visiting Dr. Tonia Brooms at [facility]. Today we recommend the following:  Orders Placed This Encounter  Procedures   CT CHEST NODULE FOLLOW UP LOW DOSE W/O   Return in about 3 months (around 05/19/2022) for with Kandice Robinsons, NP.    Please do your part to reduce the spread of COVID-19.

## 2022-02-17 NOTE — Progress Notes (Signed)
Synopsis: Referred in December 2023 for pulmonary nodules by Fatima Sanger, FNP  Subjective:   PATIENT ID: Katherine Brock GENDER: female DOB: [birth year], MRN: [id number]  Chief Complaint  Patient presents with   Consult    Lung nodule    This is a 55 year old female, past medical history of chronic back pain, history of childhood scoliosis, depression, PVCs, tobacco use.  Patient is a former smoker that quit in 2018 smoked for a total of 20 years about a quarter a pack a day.Patient had a lung cancer screening CT complete on 02/02/2022 patient was referred at visit was read as a lung RADS 4A.  Had increased patchy tree-in-bud nodules in the posterior right upper lobe with associated mild bronchiectasis the largest nodule at 7.6 mm.  Concern for either inflammatory disease versus atypical infection.  Patient has no respiratory complaints today.     Past Medical History:  Diagnosis Date   Agatston coronary artery calcium score less than 100 03/2020   Coronary calcium score 18   Anxiety    Chronic back pain    hx of childhood scoliosis, has stabilization rods in back   Depression    Frequent PVCs 2018   Evaluated with Stress Echo -> nonischemic; 16.4 %: January 2022   Kidney stones    Tobacco dependence      Family History  Problem Relation Age of Onset   Hypertension Mother    Graves' disease Mother    Hypertension Father    Polymyalgia rheumatica Father    Hypertension Brother    Breast cancer Paternal Grandmother        unsure of age     Past Surgical History:  Procedure Laterality Date   CESAREAN SECTION  2001   CHOLECYSTECTOMY  2009   GRADUATED TREADMILL EXERCISE TOLERANCE TEST  05/15/2020    Exercised 8:35 min. Peak HR 164 = 99% MPHR (reached 85% @ 6 min). 10.1 METS. HTN response. Horizontal-downsloping ST depressions ~ 3 mm in II, III, aVF, V5&V6. = ++ TM GXT. => FALSE POSITIVE-NORMAL HEART CATH   LEFT HEART CATH AND CORONARY ANGIOGRAPHY N/A 05/29/2020    Procedure: LEFT HEART CATH AND CORONARY ANGIOGRAPHY;  Surgeon: Marykay Lex, MD;  Location: [facility] INVASIVE CV LAB;  Service: Cardiovascular;  Laterality: N/A;;; Normal EF.  Normal EDP.  Angiographically normal coronary arteries.  Tortuous and relatively small caliber.   OOPHORECTOMY     scoliosis stabilization  1979   SPINAL FUSION     TOTAL ABDOMINAL HYSTERECTOMY W/ BILATERAL SALPINGOOPHORECTOMY  2007   TRANSTHORACIC ECHOCARDIOGRAM  05/29/2020   EF 60-65%. No RWMA. Normal Diastolic pressures. Normal RV.  Normal atrial sizes.  Normal valves.   TREADMILL STRESS ECHOCARDIOGRAM  02/20/2017   Exercised 9:30 min - 10.9 METS.  83% MPHR (139 BPM) No CP. Frequent PVCs noted both at rest and stress with occasional ventricular bigeminy.  No VT.  EF baseline 55% improved to 70% with no regional wall motion normalities.-Nonischemic.  Excellent exercise tolerance.     Social History   Socioeconomic History   Marital status: Divorced    Spouse name: Not on file   Number of children: 2   Years of education: Not on file   Highest education level: Not on file  Occupational History   Occupation: Airline pilot    Employer: [employer]  Tobacco Use   Smoking status: Former    Packs/day: 0.25    Years: 20.00    Total pack years: 5.00  Types: Cigarettes    Quit date: 11/24/2016    Years since quitting: 5.2   Smokeless tobacco: Never   Tobacco comments:    Currently using Juul cigarettes  Vaping Use   Vaping Use: Every day   Start date: 03/23/2016   Devices: JUUL  Substance and Sexual Activity   Alcohol use: Yes   Drug use: No   Sexual activity: Not on file  Other Topics Concern   Not on file  Social History Narrative   Walks 1-2 times per day most days of the week.      Currently working from home with the same company.      Now that she is divorced, she is a single mother.  (Her daughter is adopted at age 66).  Son is alive and well at 32 years old.   Social Determinants of Health    Financial Resource Strain: Not on file  Food Insecurity: Not on file  Transportation Needs: Not on file  Physical Activity: Not on file  Stress: Not on file  Social Connections: Not on file  Intimate Partner Violence: Not on file     No Known Allergies   Outpatient Medications Prior to Visit  Medication Sig Dispense Refill   ALPRAZolam (XANAX) 0.25 MG tablet Take 0.25 mg by mouth at bedtime as needed for anxiety or sleep.     ascorbic acid (VITAMIN C) 500 MG tablet Take 500 mg by mouth in the morning.     atorvastatin (LIPITOR) 20 MG tablet Take 20 mg by mouth every other day.     buPROPion (WELLBUTRIN XL) 150 MG 24 hr tablet Take 150 mg by mouth daily at 12 noon.  1   Cholecalciferol (VITAMIN D3) 250 MCG (10000 UT) TABS Take 1 tablet by mouth daily.     diphenhydrAMINE (SOMINEX) 25 MG tablet Take by mouth.     Ferrous Sulfate (IRON) 325 (65 Fe) MG TABS 1 tablet Orally Once a day for 30 day(s)     ibuprofen (ADVIL) 200 MG tablet Take 400 mg by mouth every 8 (eight) hours as needed (pain).     Multiple Vitamin (MULTIVITAMIN WITH MINERALS) TABS tablet Take 1 tablet by mouth in the morning. One-A-Day for Women     nadolol (CORGARD) 20 MG tablet Take 1 tablet (20 mg total) by mouth daily. 90 tablet 3   Polyethyl Glycol-Propyl Glycol (SYSTANE) 0.4-0.3 % SOLN Place 1-2 drops into both eyes 3 (three) times daily as needed (dry/irritated eyes).     SENNOSIDES PO Take 25.5 mg by mouth daily as needed. Swiss Kriss Herbal Laxative Tablets     sertraline (ZOLOFT) 100 MG tablet Take 100 mg by mouth daily at 12 noon.     No facility-administered medications prior to visit.    Review of Systems  Constitutional:  Negative for chills, fever, malaise/fatigue and weight loss.  HENT:  Negative for hearing loss, sore throat and tinnitus.   Eyes:  Negative for blurred vision and double vision.  Respiratory:  Negative for cough, hemoptysis, sputum production, shortness of breath, wheezing and stridor.    Cardiovascular:  Negative for chest pain, palpitations, orthopnea, leg swelling and PND.  Gastrointestinal:  Negative for abdominal pain, constipation, diarrhea, heartburn, nausea and vomiting.  Genitourinary:  Negative for dysuria, hematuria and urgency.  Musculoskeletal:  Negative for joint pain and myalgias.  Skin:  Negative for itching and rash.  Neurological:  Negative for dizziness, tingling, weakness and headaches.  Endo/Heme/Allergies:  Negative for environmental allergies. Does  not bruise/bleed easily.  Psychiatric/Behavioral:  Negative for depression. The patient is not nervous/anxious and does not have insomnia.   All other systems reviewed and are negative.    Objective:  Physical Exam Vitals reviewed.  Constitutional:      General: She is not in acute distress.    Appearance: She is well-developed.  HENT:     Head: Normocephalic and atraumatic.  Eyes:     General: No scleral icterus.    Conjunctiva/sclera: Conjunctivae normal.     Pupils: Pupils are equal, round, and reactive to light.  Neck:     Vascular: No JVD.     Trachea: No tracheal deviation.  Cardiovascular:     Rate and Rhythm: Normal rate and regular rhythm.     Heart sounds: Normal heart sounds. No murmur heard. Pulmonary:     Effort: Pulmonary effort is normal. No tachypnea, accessory muscle usage or respiratory distress.     Breath sounds: Normal breath sounds. No stridor. No wheezing, rhonchi or rales.  Abdominal:     General: Bowel sounds are normal. There is no distension.     Palpations: Abdomen is soft.     Tenderness: There is no abdominal tenderness.  Musculoskeletal:        General: No tenderness.     Cervical back: Neck supple.  Skin:    General: Skin is warm and dry.     Capillary Refill: Capillary refill takes less than 2 seconds.     Findings: No rash.  Neurological:     Mental Status: She is alert and oriented to person, place, and time.  Psychiatric:        Behavior: Behavior  normal.      Vitals:   02/17/22 0912  BP: 110/80  Pulse: (!) 56  SpO2: 97%  Weight: 140 lb 9.6 oz (63.8 kg)  Height: 5\' 7"  (1.702 m)   97% on RA BMI Readings from Last 3 Encounters:  02/17/22 22.02 kg/m  01/17/22 22.24 kg/m  02/05/21 26.94 kg/m   Wt Readings from Last 3 Encounters:  02/17/22 140 lb 9.6 oz (63.8 kg)  01/17/22 142 lb (64.4 kg)  02/05/21 172 lb (78 kg)     CBC    Component Value Date/Time   WBC 6.8 05/22/2020 1413   WBC 5.8 06/26/2013 1136   RBC 4.20 05/22/2020 1413   RBC 4.57 06/26/2013 1136   HGB 12.6 05/22/2020 1413   HCT 38.4 05/22/2020 1413   PLT 254 05/22/2020 1413   MCV 91 05/22/2020 1413   MCH 30.0 05/22/2020 1413   MCH 31.9 06/26/2013 1136   MCHC 32.8 05/22/2020 1413   MCHC 32.8 06/26/2013 1136   RDW 12.1 05/22/2020 1413   LYMPHSABS 2.3 06/26/2013 1136   MONOABS 0.4 06/26/2013 1136   EOSABS 0.1 06/26/2013 1136   BASOSABS 0.0 06/26/2013 1136     Chest Imaging: November 2023 lung cancer screening CT: Small 7 mm area within the right upper lobe other scattered tree-in-bud opacities likely inflammatory. The patient's images have been independently reviewed by me.    Pulmonary Functions Testing Results:     No data to display          FeNO:   Pathology:   Echocardiogram:   Heart Catheterization:     Assessment & Plan:     ICD-10-CM   1. Nodule of upper lobe of right lung  R91.1 CT CHEST NODULE FOLLOW UP Brock DOSE W/O    2. Abnormal CT lung screening  R91.8  3. Former smoker  Z87.891     4. Engages in vaping  Z72.89       Discussion:  This is a 56 year old female, past medical history of tobacco abuse, switch from cigarette use to vaping.  She is enrolled in lung cancer screening program with her primary care provider.  Plan: Reviewed her lung cancer screening CT today that shows a small nodule that is likely inflammatory.  There is other associated tree-in-bud lesions within the same area small area of  dilated airway with bronchiectasis.  This is inflammatory that is likely related to her vaping. We talked about smoking cessation today and vaping cessation. I think she needs to start using a nicotine patch to help cut down on her vaping. She says she is got lease give it a try. I have a repeat noncontrast CT scan of the chest in February/March of this year. Follow-up with me or SG, NP after lung cancer screening nodule follow-up is complete.    Current Outpatient Medications:    ALPRAZolam (XANAX) 0.25 MG tablet, Take 0.25 mg by mouth at bedtime as needed for anxiety or sleep., Disp: , Rfl:    ascorbic acid (VITAMIN C) 500 MG tablet, Take 500 mg by mouth in the morning., Disp: , Rfl:    atorvastatin (LIPITOR) 20 MG tablet, Take 20 mg by mouth every other day., Disp: , Rfl:    buPROPion (WELLBUTRIN XL) 150 MG 24 hr tablet, Take 150 mg by mouth daily at 12 noon., Disp: , Rfl: 1   Cholecalciferol (VITAMIN D3) 250 MCG (10000 UT) TABS, Take 1 tablet by mouth daily., Disp: , Rfl:    diphenhydrAMINE (SOMINEX) 25 MG tablet, Take by mouth., Disp: , Rfl:    Ferrous Sulfate (IRON) 325 (65 Fe) MG TABS, 1 tablet Orally Once a day for 30 day(s), Disp: , Rfl:    ibuprofen (ADVIL) 200 MG tablet, Take 400 mg by mouth every 8 (eight) hours as needed (pain)., Disp: , Rfl:    Multiple Vitamin (MULTIVITAMIN WITH MINERALS) TABS tablet, Take 1 tablet by mouth in the morning. One-A-Day for Women, Disp: , Rfl:    nadolol (CORGARD) 20 MG tablet, Take 1 tablet (20 mg total) by mouth daily., Disp: 90 tablet, Rfl: 3   Polyethyl Glycol-Propyl Glycol (SYSTANE) 0.4-0.3 % SOLN, Place 1-2 drops into both eyes 3 (three) times daily as needed (dry/irritated eyes)., Disp: , Rfl:    SENNOSIDES PO, Take 25.5 mg by mouth daily as needed. Swiss Kriss Herbal Laxative Tablets, Disp: , Rfl:    sertraline (ZOLOFT) 100 MG tablet, Take 100 mg by mouth daily at 12 noon., Disp: , Rfl:    Josephine IgoBradley L Tashai Catino, DO [facility] 02/17/2022 9:38 AM

## 2022-03-02 IMAGING — MG MM DIGITAL SCREENING BILAT W/ TOMO AND CAD
8 series · 8 of 24 positions shown · non-contrast
Comparison: Previous exam(s).

CLINICAL DATA: Screening.

EXAM:
DIGITAL SCREENING BILATERAL MAMMOGRAM WITH TOMOSYNTHESIS AND CAD
TECHNIQUE: Bilateral screening digital craniocaudal and mediolateral oblique
mammograms were obtained. Bilateral screening digital breast
tomosynthesis was performed. The images were evaluated with
computer-aided detection.

[R MLO synth-2D]
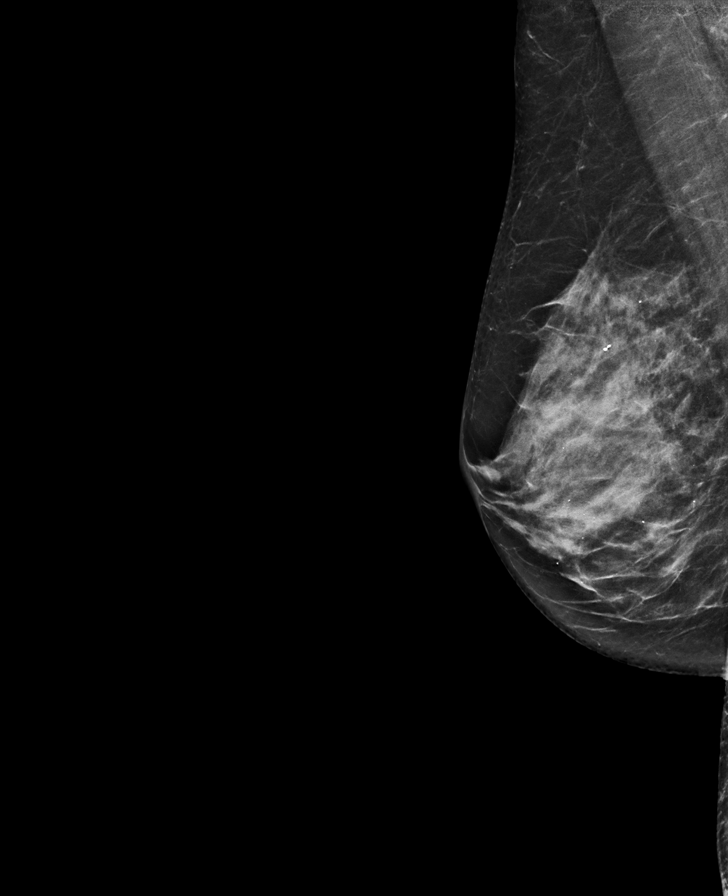

[L CC synth-2D]
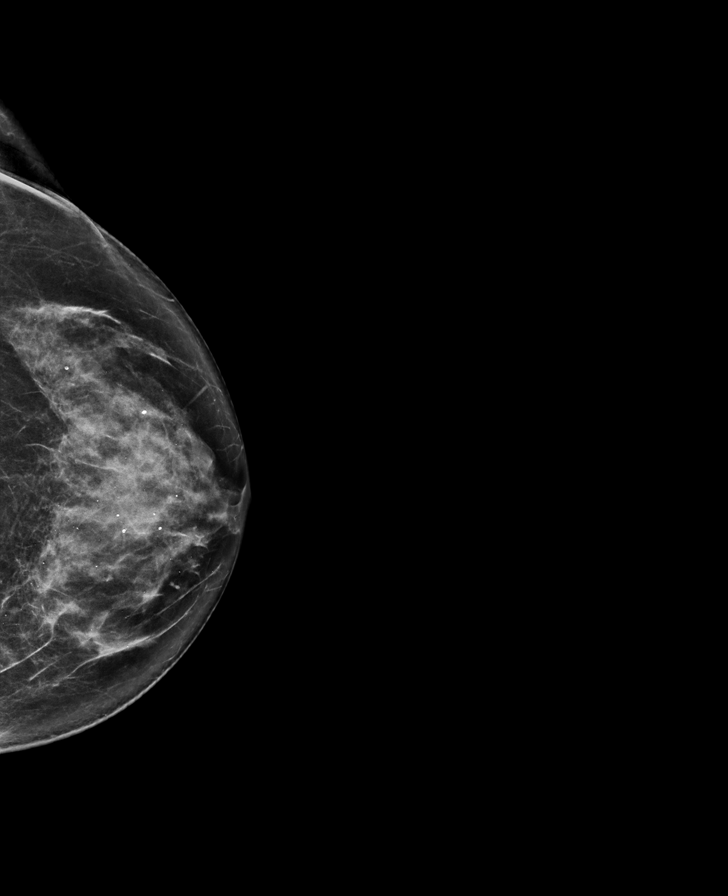

[L MLO synth-2D]
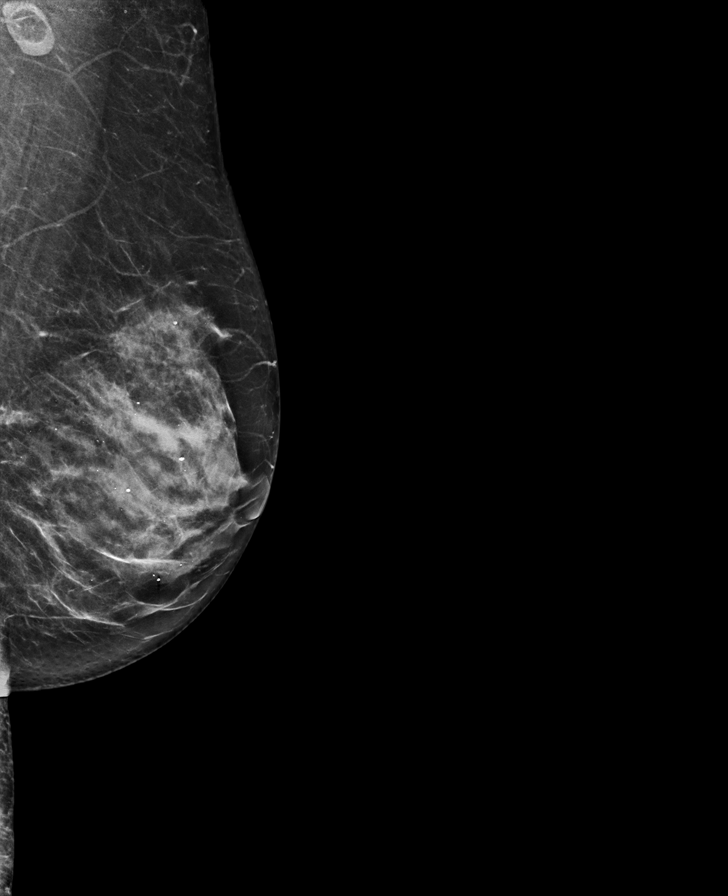

[R CC synth-2D]
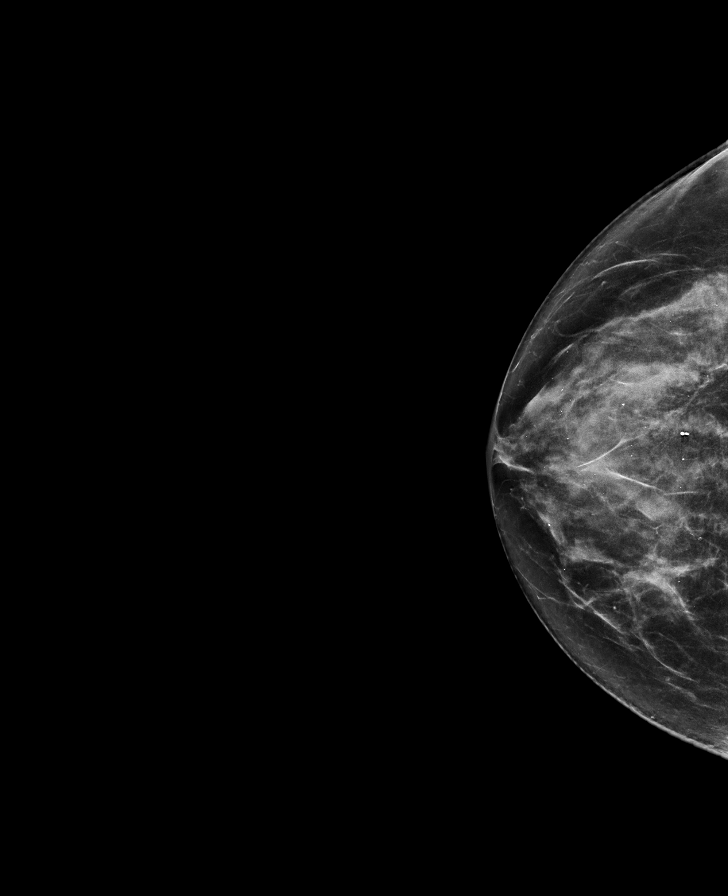

[R MLO tomo · tomo slice 35/70.0]
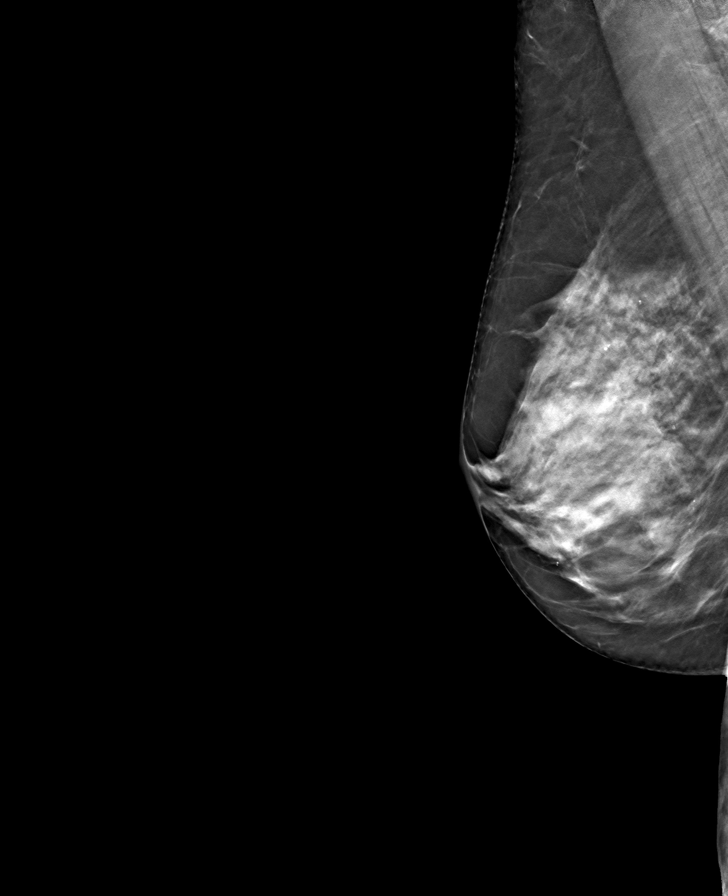

[L MLO tomo · tomo slice 36/71.0]
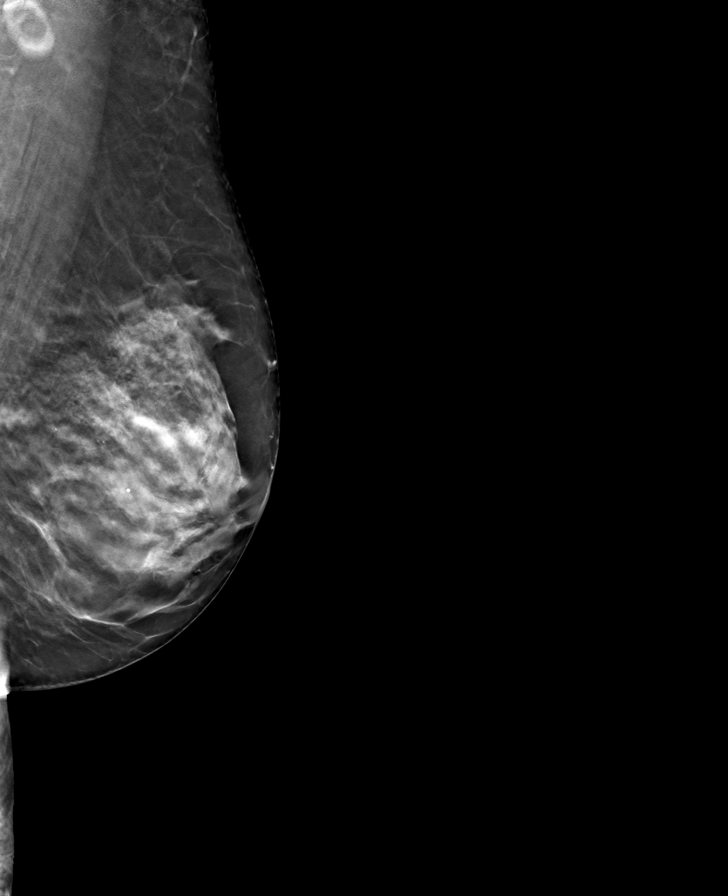

[L CC tomo · tomo slice 39/76.0]
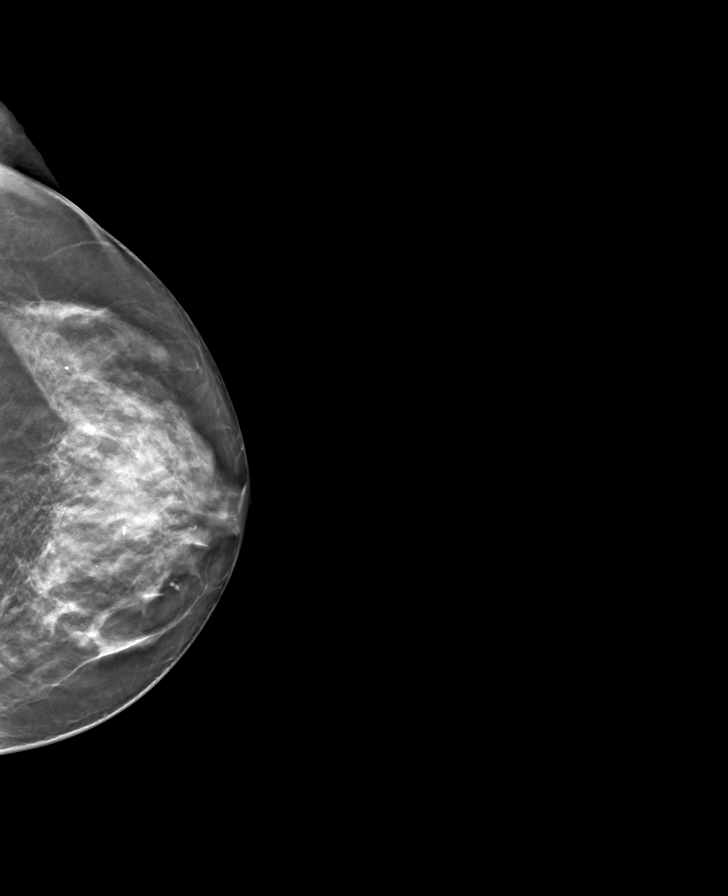

[R CC tomo · tomo slice 37/73.0]
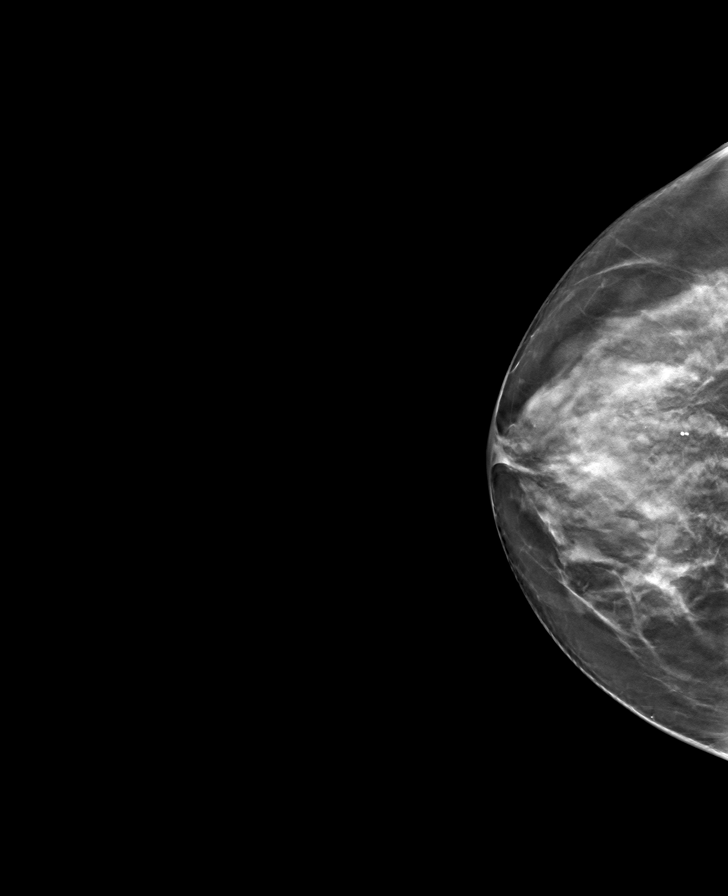

[8 of 24 positions shown; findings below may reference images not displayed]

ACR Breast Density Category c: The breast tissue is heterogeneously
dense, which may obscure small masses.
FINDINGS: There are no findings suspicious for malignancy.
IMPRESSION: No mammographic evidence of malignancy. A result letter of this
screening mammogram will be mailed directly to the patient.

RECOMMENDATION:
Screening mammogram in one year. (Code:Q3-W-BC3)

BI-RADS CATEGORY  1: Negative.

## 2022-04-19 ENCOUNTER — Ambulatory Visit
Admission: RE | Admit: 2022-04-19 | Discharge: 2022-04-19 | Disposition: A | Discharge status: Home / Self Care | Payer: 59 | Source: Ambulatory Visit | Attending: Pulmonary Disease | Admitting: Pulmonary Disease

## 2022-04-19 DIAGNOSIS — R911 Solitary pulmonary nodule: Secondary | ICD-10-CM

## 2022-04-20 ENCOUNTER — Encounter: Payer: Self-pay | Admitting: Pulmonary Disease

## 2022-04-20 NOTE — Telephone Encounter (Signed)
Mychart message sent by pt:  [facility] (supporting Garner Nash, DO)43 minutes ago (3:19 PM)    The new one that was found.do I freak out about this? I've been using nicotine patches and walking 12k steps a day.Marland Kitchendo I need to schedule an appt w you?    Dr. Valeta Harms, please advise.

## 2022-04-21 NOTE — Progress Notes (Signed)
Katherine Brock, please review mychart message sent   Thanks,  BLI  Garner Nash, DO [facility] 04/21/2022 9:31 AM

## 2022-04-22 ENCOUNTER — Encounter: Payer: Self-pay | Admitting: Pulmonary Disease

## 2022-04-22 ENCOUNTER — Ambulatory Visit: Payer: 59 | Admitting: Pulmonary Disease

## 2022-04-22 VITALS — BP 140/100 | HR 64 | Ht 67.0 in | Wt 145.6 lb

## 2022-04-22 DIAGNOSIS — R911 Solitary pulmonary nodule: Secondary | ICD-10-CM

## 2022-04-22 DIAGNOSIS — R918 Other nonspecific abnormal finding of lung field: Secondary | ICD-10-CM

## 2022-04-22 DIAGNOSIS — Z87891 Personal history of nicotine dependence: Secondary | ICD-10-CM | POA: Diagnosis not present

## 2022-04-22 DIAGNOSIS — Z7289 Other problems related to lifestyle: Secondary | ICD-10-CM

## 2022-04-22 NOTE — Progress Notes (Signed)
Synopsis: Referred in December 2023 for pulmonary nodules by Deland Pretty, MD  Subjective:   PATIENT ID: Katherine Ream GENDER: female DOB: 10-Jan-1966, MRN: [id number]  Chief Complaint  Patient presents with   Follow-up    F/up CT scan    This is a 57 year old female, past medical history of chronic back pain, history of childhood scoliosis, depression, PVCs, tobacco use.  Patient is a former smoker that quit in 2018 smoked for a total of 20 years about a quarter a pack a day.Patient had a lung cancer screening CT complete on 02/02/2022 patient was referred at visit was read as a lung RADS 4A.  Had increased patchy tree-in-bud nodules in the posterior right upper lobe with associated mild bronchiectasis the largest nodule at 7.6 mm.  Concern for either inflammatory disease versus atypical infection.  Patient has no respiratory complaints today.   OV 04/22/2022: Patient here today for follow-up after recent CT imaging.Patient had a CT scan nodule follow-up on 04/19/2022 this revealed a new right upper lobe spiculated lung nodule measuring 7.6 mm in the posterior segment of the right upper lobe abutting the major fissure concerning for possible underlying malignancy.  She is very anxious about this has several questions and we spent some time discussing the pros and cons of neck steps to include either surveillance imaging, blood based protein on the testing for restratification versus pet scan imaging.    Past Medical History:  Diagnosis Date   Agatston coronary artery calcium score less than 100 03/2020   Coronary calcium score 18   Anxiety    Chronic back pain    hx of childhood scoliosis, has stabilization rods in back   Depression    Frequent PVCs 2018   Evaluated with Stress Echo -> nonischemic; 16.4 %: January 2022   Kidney stones    Tobacco dependence      Family History  Problem Relation Age of Onset   Hypertension Mother    Graves' disease Mother    Hypertension Father     Polymyalgia rheumatica Father    Hypertension Brother    Breast cancer Paternal Grandmother        unsure of age     Past Surgical History:  Procedure Laterality Date   CESAREAN SECTION  2001   CHOLECYSTECTOMY  2009   GRADUATED TREADMILL EXERCISE TOLERANCE TEST  05/15/2020    Exercised 8:35 min. Peak HR 164 = 99% MPHR (reached 85% @ 6 min). 10.1 METS. HTN response. Horizontal-downsloping ST depressions ~ 3 mm in II, III, aVF, V5&V6. = ++ TM GXT. => FALSE POSITIVE-NORMAL HEART CATH   LEFT HEART CATH AND CORONARY ANGIOGRAPHY N/A 05/29/2020   Procedure: LEFT HEART CATH AND CORONARY ANGIOGRAPHY;  Surgeon: Leonie Man, MD;  Location: [facility];  Service: Cardiovascular;  Laterality: N/A;;; Normal EF.  Normal EDP.  Angiographically normal coronary arteries.  Tortuous and relatively small caliber.   OOPHORECTOMY     scoliosis stabilization  1979   SPINAL FUSION     TOTAL ABDOMINAL HYSTERECTOMY W/ BILATERAL SALPINGOOPHORECTOMY  2007   TRANSTHORACIC ECHOCARDIOGRAM  05/29/2020   EF 60-65%. No RWMA. Normal Diastolic pressures. Normal RV.  Normal atrial sizes.  Normal valves.   TREADMILL STRESS ECHOCARDIOGRAM  02/20/2017   Exercised 9:30 min - 10.9 METS.  83% MPHR (139 BPM) No CP. Frequent PVCs noted both at rest and stress with occasional ventricular bigeminy.  No VT.  EF baseline 55% improved to 70% with no regional wall  motion normalities.-Nonischemic.  Excellent exercise tolerance.     Social History   Socioeconomic History   Marital status: Divorced    Spouse name: Not on file   Number of children: 2   Years of education: Not on file   Highest education level: Not on file  Occupational History   Occupation: Press photographer    Employer: [employer]  Tobacco Use   Smoking status: Former    Packs/day: 0.25    Years: 20.00    Total pack years: 5.00    Types: Cigarettes    Quit date: 11/24/2016    Years since quitting: 5.4   Smokeless tobacco: Never   Tobacco comments:     Currently using Juul cigarettes  Vaping Use   Vaping Use: Every day   Start date: 03/23/2016   Devices: JUUL  Substance and Sexual Activity   Alcohol use: Yes   Drug use: No   Sexual activity: Not on file  Other Topics Concern   Not on file  Social History Narrative   Walks 1-2 times per day most days of the week.      Currently working from home with the same company.      Now that she is divorced, she is a single mother.  (Her daughter is adopted at age 83).  Son is alive and well at 52 years old.   Social Determinants of Health   Financial Resource Strain: Not on file  Food Insecurity: Not on file  Transportation Needs: Not on file  Physical Activity: Not on file  Stress: Not on file  Social Connections: Not on file  Intimate Partner Violence: Not on file     No Known Allergies   Outpatient Medications Prior to Visit  Medication Sig Dispense Refill   ALPRAZolam (XANAX) 0.25 MG tablet Take 0.25 mg by mouth at bedtime as needed for anxiety or sleep.     ascorbic acid (VITAMIN C) 500 MG tablet Take 500 mg by mouth in the morning.     atorvastatin (LIPITOR) 20 MG tablet Take 20 mg by mouth every other day.     buPROPion (WELLBUTRIN XL) 150 MG 24 hr tablet Take 150 mg by mouth daily at 12 noon.  1   Cholecalciferol (VITAMIN D3) 250 MCG (10000 UT) TABS Take 1 tablet by mouth daily.     diphenhydrAMINE (SOMINEX) 25 MG tablet Take by mouth.     Ferrous Sulfate (IRON) 325 (65 Fe) MG TABS 1 tablet Orally Once a day for 30 day(s)     ibuprofen (ADVIL) 200 MG tablet Take 400 mg by mouth every 8 (eight) hours as needed (pain).     Multiple Vitamin (MULTIVITAMIN WITH MINERALS) TABS tablet Take 1 tablet by mouth in the morning. One-A-Day for Women     nadolol (CORGARD) 20 MG tablet Take 1 tablet (20 mg total) by mouth daily. 90 tablet 3   Polyethyl Glycol-Propyl Glycol (SYSTANE) 0.4-0.3 % SOLN Place 1-2 drops into both eyes 3 (three) times daily as needed (dry/irritated eyes).      SENNOSIDES PO Take 25.5 mg by mouth daily as needed. Swiss Kriss Herbal Laxative Tablets     sertraline (ZOLOFT) 100 MG tablet Take 100 mg by mouth daily at 12 noon.     No facility-administered medications prior to visit.    Review of Systems  Constitutional:  Negative for chills, fever, malaise/fatigue and weight loss.  HENT:  Negative for hearing loss, sore throat and tinnitus.   Eyes:  Negative for blurred vision and double vision.  Respiratory:  Positive for shortness of breath. Negative for cough, hemoptysis, sputum production, wheezing and stridor.   Cardiovascular:  Negative for chest pain, palpitations, orthopnea, leg swelling and PND.  Gastrointestinal:  Negative for abdominal pain, constipation, diarrhea, heartburn, nausea and vomiting.  Genitourinary:  Negative for dysuria, hematuria and urgency.  Musculoskeletal:  Negative for joint pain and myalgias.  Skin:  Negative for itching and rash.  Neurological:  Negative for dizziness, tingling, weakness and headaches.  Endo/Heme/Allergies:  Negative for environmental allergies. Does not bruise/bleed easily.  Psychiatric/Behavioral:  Negative for depression. The patient is nervous/anxious. The patient does not have insomnia.   All other systems reviewed and are negative.    Objective:  Physical Exam Vitals reviewed.  Constitutional:      General: She is not in acute distress.    Appearance: She is well-developed.  HENT:     Head: Normocephalic and atraumatic.  Eyes:     General: No scleral icterus.    Conjunctiva/sclera: Conjunctivae normal.     Pupils: Pupils are equal, round, and reactive to light.  Neck:     Vascular: No JVD.     Trachea: No tracheal deviation.  Cardiovascular:     Rate and Rhythm: Normal rate and regular rhythm.     Heart sounds: Normal heart sounds. No murmur heard. Pulmonary:     Effort: Pulmonary effort is normal. No tachypnea, accessory muscle usage or respiratory distress.     Breath sounds:  No stridor. No wheezing, rhonchi or rales.  Abdominal:     General: There is no distension.     Palpations: Abdomen is soft.     Tenderness: There is no abdominal tenderness.  Musculoskeletal:        General: No tenderness.     Cervical back: Neck supple.  Lymphadenopathy:     Cervical: No cervical adenopathy.  Skin:    General: Skin is warm and dry.     Capillary Refill: Capillary refill takes less than 2 seconds.     Findings: No rash.  Neurological:     Mental Status: She is alert and oriented to person, place, and time.  Psychiatric:        Behavior: Behavior normal.      Vitals:   04/22/22 1323  BP: (!) 140/100  Pulse: 64  SpO2: 100%  Weight: 145 lb 9.6 oz (66 kg)  Height: 5' 7"$  (1.702 m)   100% on RA BMI Readings from Last 3 Encounters:  04/22/22 22.80 kg/m  02/17/22 22.02 kg/m  01/17/22 22.24 kg/m   Wt Readings from Last 3 Encounters:  04/22/22 145 lb 9.6 oz (66 kg)  02/17/22 140 lb 9.6 oz (63.8 kg)  01/17/22 142 lb (64.4 kg)     CBC    Component Value Date/Time   WBC 6.8 05/22/2020 1413   WBC 5.8 06/26/2013 1136   RBC 4.20 05/22/2020 1413   RBC 4.57 06/26/2013 1136   HGB 12.6 05/22/2020 1413   HCT 38.4 05/22/2020 1413   PLT 254 05/22/2020 1413   MCV 91 05/22/2020 1413   MCH 30.0 05/22/2020 1413   MCH 31.9 06/26/2013 1136   MCHC 32.8 05/22/2020 1413   MCHC 32.8 06/26/2013 1136   RDW 12.1 05/22/2020 1413   LYMPHSABS 2.3 06/26/2013 1136   MONOABS 0.4 06/26/2013 1136   EOSABS 0.1 06/26/2013 1136   BASOSABS 0.0 06/26/2013 1136     Chest Imaging: November 2023 lung cancer screening CT: Small  7 mm area within the right upper lobe other scattered tree-in-bud opacities likely inflammatory. The patient's images have been independently reviewed by me.    February 2024 CT chest: Right upper lobe new 7.6 mm nodule adjacent to the major fissure line. The patient's images have been independently reviewed by me.    Pulmonary Functions Testing  Results:     No data to display          FeNO:   Pathology:   Echocardiogram:   Heart Catheterization:     Assessment & Plan:     ICD-10-CM   1. Lung nodule  R91.1 NM PET Image Initial (PI) Skull Base To Thigh (F-18 FDG)    2. Abnormal CT lung screening  R91.8     3. Former smoker  Z87.891     4. Engages in vaping  Z72.89       Discussion:  This is a 57 year old female past medical history of tobacco abuse, has switched from cigarette use to vaping.  She is using nicotine patches to help quit.  She was enrolled in lung cancer screening program had her previous lung cancer screening CT last year with a follow-up nodule scan in February 2024.  Plan: She has a new 7.6 mm spiculated nodule against the major fissure concerning for potential underlying malignancy. Today in the office we talked about the risk benefits and alternatives of various restratification mechanisms to include short-term CT follow-up, blood based propionic testing as well as nuclear medicine pet imaging. Patient would like to proceed with nuclear medicine pet imaging after discussing the pros and cons of restratification. We talked about her nodule probability of malignancy. At this time we will obtain the PET scan and have her see [place] back in a couple of weeks. Pending upon the PET scan results which I did explain could be falsely negative in the setting that the lesion is small.  However with the rapid development and short period of time she is concerned about a potential aggressive malignancy that is growing fast. Patient is agreeable to proceed. Return to clinic to see [place] in 4 weeks after PET complete.    Current Outpatient Medications:    ALPRAZolam (XANAX) 0.25 MG tablet, Take 0.25 mg by mouth at bedtime as needed for anxiety or sleep., Disp: , Rfl:    ascorbic acid (VITAMIN C) 500 MG tablet, Take 500 mg by mouth in the morning., Disp: , Rfl:    atorvastatin (LIPITOR) 20 MG tablet, Take 20 mg by  mouth every other day., Disp: , Rfl:    buPROPion (WELLBUTRIN XL) 150 MG 24 hr tablet, Take 150 mg by mouth daily at 12 noon., Disp: , Rfl: 1   Cholecalciferol (VITAMIN D3) 250 MCG (10000 UT) TABS, Take 1 tablet by mouth daily., Disp: , Rfl:    diphenhydrAMINE (SOMINEX) 25 MG tablet, Take by mouth., Disp: , Rfl:    Ferrous Sulfate (IRON) 325 (65 Fe) MG TABS, 1 tablet Orally Once a day for 30 day(s), Disp: , Rfl:    ibuprofen (ADVIL) 200 MG tablet, Take 400 mg by mouth every 8 (eight) hours as needed (pain)., Disp: , Rfl:    Multiple Vitamin (MULTIVITAMIN WITH MINERALS) TABS tablet, Take 1 tablet by mouth in the morning. One-A-Day for Women, Disp: , Rfl:    nadolol (CORGARD) 20 MG tablet, Take 1 tablet (20 mg total) by mouth daily., Disp: 90 tablet, Rfl: 3   Polyethyl Glycol-Propyl Glycol (SYSTANE) 0.4-0.3 % SOLN, Place 1-2 drops into  both eyes 3 (three) times daily as needed (dry/irritated eyes)., Disp: , Rfl:    SENNOSIDES PO, Take 25.5 mg by mouth daily as needed. Swiss Kriss Herbal Laxative Tablets, Disp: , Rfl:    sertraline (ZOLOFT) 100 MG tablet, Take 100 mg by mouth daily at 12 noon., Disp: , Rfl:    Garner Nash, DO [facility] 04/22/2022 1:43 PM

## 2022-04-22 NOTE — Telephone Encounter (Signed)
Dr. Valeta Harms, what type of CT scan would you like for her to have in 3 months? With contrast or without contrast?

## 2022-04-22 NOTE — Patient Instructions (Signed)
Thank you for visiting Dr. Valeta Harms at [facility]. Today we recommend the following:  Orders Placed This Encounter  Procedures   NM PET Image Initial (PI) Skull Base To Thigh (F-18 FDG)   Return in about 4 weeks (around 05/20/2022) for with Eric Form, NP, or Dr. Valeta Harms.    Please do your part to reduce the spread of COVID-19.

## 2022-05-03 ENCOUNTER — Telehealth: Payer: Self-pay | Admitting: Pulmonary Disease

## 2022-05-03 NOTE — Telephone Encounter (Signed)
Pt aware we got the [place]

## 2022-05-03 NOTE — Telephone Encounter (Signed)
Patient would like to know if PET scan has been authorized. Insurance is [employer]. Patient phone number is [phone].

## 2022-05-05 ENCOUNTER — Ambulatory Visit (HOSPITAL_COMMUNITY)
Admission: RE | Admit: 2022-05-05 | Discharge: 2022-05-05 | Disposition: A | Discharge status: Home / Self Care | Payer: 59 | Source: Ambulatory Visit | Attending: Pulmonary Disease | Admitting: Pulmonary Disease

## 2022-05-05 ENCOUNTER — Encounter: Payer: Self-pay | Admitting: Pulmonary Disease

## 2022-05-05 DIAGNOSIS — R911 Solitary pulmonary nodule: Secondary | ICD-10-CM | POA: Diagnosis not present

## 2022-05-05 LAB — GLUCOSE, CAPILLARY: Glucose-Capillary: 90 mg/dL (ref 70–99)

## 2022-05-05 MED ORDER — FLUDEOXYGLUCOSE F - 18 (FDG) INJECTION
7.2000 | Freq: Once | INTRAVENOUS | Status: AC
  Administered 2022-05-05: 7.2 via INTRAVENOUS

## 2022-05-10 ENCOUNTER — Encounter: Payer: Self-pay | Admitting: Pulmonary Disease

## 2022-05-10 NOTE — Telephone Encounter (Signed)
Pt calling to go over PET Scan results

## 2022-05-12 ENCOUNTER — Encounter: Payer: Self-pay | Admitting: Pulmonary Disease

## 2022-05-12 NOTE — Telephone Encounter (Signed)
Dr. Valeta Harms responded. See previous encounter. Nothing further needed. Thanks.

## 2022-05-12 NOTE — Telephone Encounter (Signed)
PT has her Dr. Calling us now asking why it is taking so long to have results explained to her by Dr. Valeta Harms. I let this Dr. know we see her messages and have replied to her on all but this latest [facility] message. I will bring this to the attention of Magda Paganini as well.

## 2022-05-12 NOTE — Telephone Encounter (Signed)
Katherine Brock, Dr Valeta Harms still not reviewed her PET and she has sent several emails asking for them. She sent the following email. Can you please advise on her results? Thank you!   I never imagined it would take a week or longer to speak with you regarding this pet scan.  Can any of the pulmonologists there get back to me please? If not, please mail me everything regarding this so I can find a doctor who will speak with me.   Thank you, Thalia Bloodgood

## 2022-05-12 NOTE — Telephone Encounter (Addendum)
See MyChart msg from 2/29. Pt has been given results.

## 2022-05-16 NOTE — Telephone Encounter (Signed)
Pt given results, closing encounter

## 2022-05-17 ENCOUNTER — Telehealth: Payer: Self-pay | Admitting: Pulmonary Disease

## 2022-05-17 NOTE — Telephone Encounter (Signed)
Hello Katherine Brock and Katherine Brock-   I am calling PT to sched recalls today. This lady was very upset about the several calls she has made to the office to get PET results. You can see the several telephone encounters and [facility] messages over several days.   The PT last reached out to [place] via [facility] message where she canceled her FU appt and in that cancellation text she expressed more discontent yet we just canceled it w/o addressing her concerns further.  She would not resched w/me saying "Would you want to sched with this Dr.after not getting any response?" And "He knew how uptight I was over this testing."  I apologized and tried to give a soft explanation as to why (ie test results were not avail when she called in.   I told her I would bring it to my managers attention to avoid further escalation. Perhaps we can get her to set an appt after this. Please advise. Thank you.

## 2022-05-17 NOTE — Telephone Encounter (Signed)
PCCM:  Doristine Devoid thanks for letting me know. Please forward to Mickel Baas Readling to follow up on as well.   Thanks  [place], DO [facility] 05/17/2022 12:39 PM

## 2022-05-19 NOTE — Progress Notes (Signed)
Please see mychart documentation.   Thanks,  BLI  Garner Nash, DO [facility] 05/19/2022 5:00 PM

## 2022-05-20 ENCOUNTER — Ambulatory Visit: Payer: 59 | Admitting: Pulmonary Disease

## 2022-05-23 NOTE — Telephone Encounter (Signed)
Per Otilio Carpen on 3/15 at 3:19pm, "left vm that I will be in office on Tuesday and left main phone number.".

## 2022-07-02 LAB — LAB REPORT - SCANNED: EGFR (Non-African Amer.): 92

## 2022-09-05 ENCOUNTER — Other Ambulatory Visit: Payer: Self-pay

## 2022-09-05 ENCOUNTER — Other Ambulatory Visit: Payer: Self-pay | Admitting: Diagnostic Radiology

## 2022-09-05 DIAGNOSIS — Z1231 Encounter for screening mammogram for malignant neoplasm of breast: Secondary | ICD-10-CM

## 2022-09-22 ENCOUNTER — Inpatient Hospital Stay (HOSPITAL_BASED_OUTPATIENT_CLINIC_OR_DEPARTMENT_OTHER): Admission: RE | Admit: 2022-09-22 | Payer: 59 | Source: Ambulatory Visit | Admitting: Radiology

## 2022-09-27 ENCOUNTER — Inpatient Hospital Stay (HOSPITAL_BASED_OUTPATIENT_CLINIC_OR_DEPARTMENT_OTHER): Admission: RE | Admit: 2022-09-27 | Payer: 59 | Source: Ambulatory Visit | Admitting: Radiology

## 2022-10-06 ENCOUNTER — Ambulatory Visit (HOSPITAL_BASED_OUTPATIENT_CLINIC_OR_DEPARTMENT_OTHER)
Admission: RE | Admit: 2022-10-06 | Discharge: 2022-10-06 | Disposition: A | Discharge status: Home / Self Care | Payer: 59 | Source: Ambulatory Visit | Attending: Diagnostic Radiology | Admitting: Diagnostic Radiology

## 2022-10-06 DIAGNOSIS — Z1231 Encounter for screening mammogram for malignant neoplasm of breast: Secondary | ICD-10-CM | POA: Insufficient documentation

## 2023-01-25 NOTE — Progress Notes (Signed)
Cardiology Office Note:  .   Date:  01/27/2023  ID:  [name], DOB [birth year], MRN [id number] PCP: Merri Brunette, MD  [facility] Providers Cardiologist:  Bryan Lemma, MD  }   History of Present Illness: Marland Kitchen   Katherine Brock is a 57 y.o. female with a history of coronary artery calcification on CT, frequent PVCs, hypertension, and hyperlipidemia. Last seen by Bernadene Person DNP on 01/17/2022 at which time she was decreased on nadolol dose due to fatigue, and is here for annual follow up.   She denies any chest pain, palpitations, or profound fatigue.  She is inquiring about stopping the nadolol as she has been feeling so well.  She did mention that her mother died this past July 28, 2024and she is caring for her her dad by being supportive.  She did not express that he is having any significant health problems.  They are coming up to the holiday season and she feels as if he will be needing her more so at this time.  ROS: As above otherwise negative  Studies Reviewed: .   Echocardiogram 05/29/2020 1. Left ventricular ejection fraction, by estimation, is 60 to 65%. The  left ventricle has normal function. The left ventricle has no regional  wall motion abnormalities. Left ventricular diastolic parameters were  normal.   2. Right ventricular systolic function is normal. The right ventricular  size is normal. There is normal pulmonary artery systolic pressure.   3. The mitral valve is normal in structure. No evidence of mitral valve  regurgitation. No evidence of mitral stenosis.   4. The aortic valve is normal in structure. Aortic valve regurgitation is  not visualized. No aortic stenosis is present.   5. The inferior vena cava is dilated in size with >50% respiratory  variability, suggesting right atrial pressure of 8 mmHg.   LHC 05/29/2020 The left ventricular systolic function is normal. LV end diastolic pressure is normal. The left ventricular ejection fraction is  55-65% by visual estimate. There is no aortic valve stenosis.   SUMMARY Angiographically Normal Coronary Arteries -> tortuous and relatively small caliber Normal LV Function Normal EDP.   EKG Interpretation Date/Time:  Friday January 27 2023 10:51:11 EST Ventricular Rate:  59 PR Interval:  174 QRS Duration:  96 QT Interval:  408 QTC Calculation: 403 R Axis:   88  Text Interpretation: Sinus bradycardia When compared with ECG of 26-Jun-2013 12:43, PREVIOUS ECG IS PRESENT Confirmed by Joni Reining [phone]) on 01/27/2023 11:14:53 AM    Physical Exam:   VS:  BP 114/86   Pulse (!) 59   Ht 5\' 7"  (1.702 m)   Wt 144 lb 9.6 oz (65.6 kg)   SpO2 99%   BMI 22.65 kg/m    Wt Readings from Last 3 Encounters:  01/27/23 144 lb 9.6 oz (65.6 kg)  04/22/22 145 lb 9.6 oz (66 kg)  02/17/22 140 lb 9.6 oz (63.8 kg)    GEN: Well nourished, well developed in no acute distress NECK: No JVD; No carotid bruits CARDIAC: RRR, no murmurs, rubs, gallops RESPIRATORY:  Clear to auscultation without rales, wheezing or rhonchi scant crackles in the left lower lobe, clear otherwise ABDOMEN: Soft, non-tender, non-distended EXTREMITIES:  No edema; No deformity   ASSESSMENT AND PLAN: .    History of palpitations: After discussion with the patient we will leave her on the nadolol as she is coming up into the holiday season which can be very stressful in general, and  especially so this year since she lost her mom in July 2024.  Her son who is graduated from [employer] is also moving to [facility] [employer] [facility] which does present a stressor as well.  We will plan to revisit this in approximately 6 months and evaluate briefly stopping the nadolol to see how well she does off of it.   2.  Moderate obesity: She has lost just over 40 pounds feels great and is doing well.  Her BMI is normal.  I congratulated her on this endeavor.  Her primary care provider, Abelardo Diesel, nurse practitioner is following her labs.  Continue  weight management   3. Hyercholesterolemia: Currently on atorvastatin 20 mg every other day.  Labs are followed by primary care provider.       Signed, Bettey Mare. Liborio Nixon, ANP, AACC

## 2023-01-27 ENCOUNTER — Encounter: Payer: Self-pay | Admitting: Adult Health

## 2023-01-27 ENCOUNTER — Ambulatory Visit: Payer: 59 | Attending: Adult Health | Admitting: Adult Health

## 2023-01-27 VITALS — BP 114/86 | HR 59 | Ht 67.0 in | Wt 144.6 lb

## 2023-01-27 DIAGNOSIS — E782 Mixed hyperlipidemia: Secondary | ICD-10-CM | POA: Diagnosis not present

## 2023-01-27 DIAGNOSIS — I2510 Atherosclerotic heart disease of native coronary artery without angina pectoris: Secondary | ICD-10-CM | POA: Diagnosis not present

## 2023-01-27 DIAGNOSIS — I493 Ventricular premature depolarization: Secondary | ICD-10-CM | POA: Diagnosis not present

## 2023-01-27 DIAGNOSIS — I10 Essential (primary) hypertension: Secondary | ICD-10-CM

## 2023-01-27 MED ORDER — NADOLOL 20 MG PO TABS: 20.0000 mg | ORAL_TABLET | Freq: Every day | ORAL | 3 refills | Status: DC

## 2023-01-27 NOTE — Patient Instructions (Signed)
Medication Instructions:  No Changes *If you need a refill on your cardiac medications before your next appointment, please call your pharmacy*   Lab Work: No labs If you have labs (blood work) drawn today and your tests are completely normal, you will receive your results only by: MyChart Message (if you have MyChart) OR A paper copy in the mail If you have any lab test that is abnormal or we need to change your treatment, we will call you to review the results.   Testing/Procedures: No Testing   Follow-Up: At [facility], you and your health needs are our priority.  As part of our continuing mission to provide you with exceptional heart care, we have created designated Provider Care Teams.  These Care Teams include your primary Cardiologist (physician) and Advanced Practice Providers (APPs -  Physician Assistants and Nurse Practitioners) who all work together to provide you with the care you need, when you need it.  We recommend signing up for the patient portal called "MyChart".  Sign up information is provided on this After Visit Summary.  MyChart is used to connect with patients for Virtual Visits (Telemedicine).  Patients are able to view lab/test results, encounter notes, upcoming appointments, etc.  Non-urgent messages can be sent to your provider as well.   To learn more about what you can do with MyChart, go to [email or web address].    Your next appointment:   6 month(s)  Provider:   Joni Reining, DNP, ANP  or , Bryan Lemma, MD

## 2023-04-25 ENCOUNTER — Encounter: Payer: Self-pay | Admitting: Cardiology

## 2023-04-26 NOTE — Telephone Encounter (Signed)
Sent message to [facility] pharm to responded   Is it okay to use  Celebrex with cardiac medication?

## 2023-04-30 NOTE — Telephone Encounter (Signed)
 I agree with Melissa.  I do not think is any interaction with nadolol.  Bryan Lemma, MD

## 2023-11-07 ENCOUNTER — Other Ambulatory Visit: Payer: Self-pay | Admitting: Internal Medicine

## 2023-11-07 DIAGNOSIS — Z1231 Encounter for screening mammogram for malignant neoplasm of breast: Secondary | ICD-10-CM

## 2024-01-11 LAB — LAB REPORT - SCANNED: EGFR: 93

## 2024-04-08 ENCOUNTER — Other Ambulatory Visit: Payer: Self-pay | Admitting: Adult Health

## 2024-05-06 ENCOUNTER — Ambulatory Visit: Admitting: Nurse Practitioner
